# Patient Record
Sex: Female | Born: 1950 | Race: White | Hispanic: No | Marital: Married | Smoking: Never smoker
Health system: Southern US, Community
[De-identification: ages and names within clinical notes are randomized; demographics above are authoritative.]

## PROBLEM LIST (undated history)

## (undated) DIAGNOSIS — N200 Calculus of kidney: Secondary | ICD-10-CM

## (undated) DIAGNOSIS — I10 Essential (primary) hypertension: Secondary | ICD-10-CM

## (undated) HISTORY — DX: Calculus of kidney: N20.0

## (undated) HISTORY — DX: Essential (primary) hypertension: I10

## (undated) HISTORY — PX: APPENDECTOMY: SHX54

## (undated) HISTORY — PX: TONSILLECTOMY: SUR1361

## (undated) HISTORY — PX: CHOLECYSTECTOMY: SHX55

## (undated) HISTORY — PX: TOTAL ABDOMINAL HYSTERECTOMY: SHX209

---

## 2009-05-29 ENCOUNTER — Ambulatory Visit: Payer: Self-pay | Admitting: Family Medicine

## 2010-04-29 ENCOUNTER — Ambulatory Visit: Payer: Self-pay | Admitting: Internal Medicine

## 2011-05-13 ENCOUNTER — Ambulatory Visit: Payer: Self-pay | Admitting: Internal Medicine

## 2012-05-13 ENCOUNTER — Ambulatory Visit: Payer: Self-pay | Admitting: Internal Medicine

## 2013-03-17 LAB — HM COLONOSCOPY

## 2013-07-17 DIAGNOSIS — Z0000 Encounter for general adult medical examination without abnormal findings: Secondary | ICD-10-CM | POA: Diagnosis not present

## 2013-07-17 DIAGNOSIS — Z23 Encounter for immunization: Secondary | ICD-10-CM | POA: Diagnosis not present

## 2013-07-17 DIAGNOSIS — I10 Essential (primary) hypertension: Secondary | ICD-10-CM | POA: Diagnosis not present

## 2013-07-17 DIAGNOSIS — R609 Edema, unspecified: Secondary | ICD-10-CM | POA: Diagnosis not present

## 2013-07-17 DIAGNOSIS — E785 Hyperlipidemia, unspecified: Secondary | ICD-10-CM | POA: Diagnosis not present

## 2013-07-18 DIAGNOSIS — D2360 Other benign neoplasm of skin of unspecified upper limb, including shoulder: Secondary | ICD-10-CM | POA: Diagnosis not present

## 2013-07-18 DIAGNOSIS — L821 Other seborrheic keratosis: Secondary | ICD-10-CM | POA: Diagnosis not present

## 2013-07-18 DIAGNOSIS — Z872 Personal history of diseases of the skin and subcutaneous tissue: Secondary | ICD-10-CM | POA: Diagnosis not present

## 2013-07-18 DIAGNOSIS — Z1283 Encounter for screening for malignant neoplasm of skin: Secondary | ICD-10-CM | POA: Diagnosis not present

## 2013-07-18 DIAGNOSIS — D2320 Other benign neoplasm of skin of unspecified ear and external auricular canal: Secondary | ICD-10-CM | POA: Diagnosis not present

## 2013-07-18 DIAGNOSIS — D235 Other benign neoplasm of skin of trunk: Secondary | ICD-10-CM | POA: Diagnosis not present

## 2013-07-18 DIAGNOSIS — D485 Neoplasm of uncertain behavior of skin: Secondary | ICD-10-CM | POA: Diagnosis not present

## 2013-08-04 ENCOUNTER — Ambulatory Visit: Payer: Self-pay | Admitting: Internal Medicine

## 2013-08-04 DIAGNOSIS — Z1231 Encounter for screening mammogram for malignant neoplasm of breast: Secondary | ICD-10-CM | POA: Diagnosis not present

## 2013-08-04 LAB — HM MAMMOGRAPHY: HM Mammogram: NORMAL

## 2013-08-25 ENCOUNTER — Ambulatory Visit: Payer: Self-pay | Admitting: Gastroenterology

## 2013-08-25 DIAGNOSIS — M1610 Unilateral primary osteoarthritis, unspecified hip: Secondary | ICD-10-CM | POA: Diagnosis not present

## 2013-08-25 DIAGNOSIS — M810 Age-related osteoporosis without current pathological fracture: Secondary | ICD-10-CM | POA: Diagnosis not present

## 2013-08-25 DIAGNOSIS — Z880 Allergy status to penicillin: Secondary | ICD-10-CM | POA: Diagnosis not present

## 2013-08-25 DIAGNOSIS — Z7982 Long term (current) use of aspirin: Secondary | ICD-10-CM | POA: Diagnosis not present

## 2013-08-25 DIAGNOSIS — Z860100 Personal history of colon polyps, unspecified: Secondary | ICD-10-CM | POA: Diagnosis not present

## 2013-08-25 DIAGNOSIS — I10 Essential (primary) hypertension: Secondary | ICD-10-CM | POA: Diagnosis not present

## 2013-08-25 DIAGNOSIS — K648 Other hemorrhoids: Secondary | ICD-10-CM | POA: Diagnosis not present

## 2013-08-25 DIAGNOSIS — E785 Hyperlipidemia, unspecified: Secondary | ICD-10-CM | POA: Diagnosis not present

## 2013-08-25 DIAGNOSIS — Z79899 Other long term (current) drug therapy: Secondary | ICD-10-CM | POA: Diagnosis not present

## 2013-08-25 DIAGNOSIS — Z1211 Encounter for screening for malignant neoplasm of colon: Secondary | ICD-10-CM | POA: Diagnosis not present

## 2013-08-25 DIAGNOSIS — D126 Benign neoplasm of colon, unspecified: Secondary | ICD-10-CM | POA: Diagnosis not present

## 2013-08-28 DIAGNOSIS — M999 Biomechanical lesion, unspecified: Secondary | ICD-10-CM | POA: Diagnosis not present

## 2013-08-28 DIAGNOSIS — IMO0002 Reserved for concepts with insufficient information to code with codable children: Secondary | ICD-10-CM | POA: Diagnosis not present

## 2013-08-28 DIAGNOSIS — M5137 Other intervertebral disc degeneration, lumbosacral region: Secondary | ICD-10-CM | POA: Diagnosis not present

## 2013-08-28 DIAGNOSIS — M5430 Sciatica, unspecified side: Secondary | ICD-10-CM | POA: Diagnosis not present

## 2013-08-29 DIAGNOSIS — IMO0002 Reserved for concepts with insufficient information to code with codable children: Secondary | ICD-10-CM | POA: Diagnosis not present

## 2013-08-29 DIAGNOSIS — M5137 Other intervertebral disc degeneration, lumbosacral region: Secondary | ICD-10-CM | POA: Diagnosis not present

## 2013-08-29 DIAGNOSIS — M999 Biomechanical lesion, unspecified: Secondary | ICD-10-CM | POA: Diagnosis not present

## 2013-08-29 DIAGNOSIS — M5430 Sciatica, unspecified side: Secondary | ICD-10-CM | POA: Diagnosis not present

## 2013-08-30 DIAGNOSIS — M999 Biomechanical lesion, unspecified: Secondary | ICD-10-CM | POA: Diagnosis not present

## 2013-08-30 DIAGNOSIS — IMO0002 Reserved for concepts with insufficient information to code with codable children: Secondary | ICD-10-CM | POA: Diagnosis not present

## 2013-08-30 DIAGNOSIS — M5137 Other intervertebral disc degeneration, lumbosacral region: Secondary | ICD-10-CM | POA: Diagnosis not present

## 2013-08-30 DIAGNOSIS — M5430 Sciatica, unspecified side: Secondary | ICD-10-CM | POA: Diagnosis not present

## 2013-09-01 DIAGNOSIS — M5430 Sciatica, unspecified side: Secondary | ICD-10-CM | POA: Diagnosis not present

## 2013-09-01 DIAGNOSIS — M999 Biomechanical lesion, unspecified: Secondary | ICD-10-CM | POA: Diagnosis not present

## 2013-09-01 DIAGNOSIS — M5137 Other intervertebral disc degeneration, lumbosacral region: Secondary | ICD-10-CM | POA: Diagnosis not present

## 2013-09-01 DIAGNOSIS — IMO0002 Reserved for concepts with insufficient information to code with codable children: Secondary | ICD-10-CM | POA: Diagnosis not present

## 2013-09-05 DIAGNOSIS — M999 Biomechanical lesion, unspecified: Secondary | ICD-10-CM | POA: Diagnosis not present

## 2013-09-05 DIAGNOSIS — M5137 Other intervertebral disc degeneration, lumbosacral region: Secondary | ICD-10-CM | POA: Diagnosis not present

## 2013-09-05 DIAGNOSIS — M5430 Sciatica, unspecified side: Secondary | ICD-10-CM | POA: Diagnosis not present

## 2013-09-05 DIAGNOSIS — IMO0002 Reserved for concepts with insufficient information to code with codable children: Secondary | ICD-10-CM | POA: Diagnosis not present

## 2013-09-06 DIAGNOSIS — M5137 Other intervertebral disc degeneration, lumbosacral region: Secondary | ICD-10-CM | POA: Diagnosis not present

## 2013-09-06 DIAGNOSIS — IMO0002 Reserved for concepts with insufficient information to code with codable children: Secondary | ICD-10-CM | POA: Diagnosis not present

## 2013-09-06 DIAGNOSIS — M5430 Sciatica, unspecified side: Secondary | ICD-10-CM | POA: Diagnosis not present

## 2013-09-06 DIAGNOSIS — M999 Biomechanical lesion, unspecified: Secondary | ICD-10-CM | POA: Diagnosis not present

## 2013-09-08 DIAGNOSIS — M999 Biomechanical lesion, unspecified: Secondary | ICD-10-CM | POA: Diagnosis not present

## 2013-09-08 DIAGNOSIS — M5430 Sciatica, unspecified side: Secondary | ICD-10-CM | POA: Diagnosis not present

## 2013-09-08 DIAGNOSIS — M5137 Other intervertebral disc degeneration, lumbosacral region: Secondary | ICD-10-CM | POA: Diagnosis not present

## 2013-09-08 DIAGNOSIS — IMO0002 Reserved for concepts with insufficient information to code with codable children: Secondary | ICD-10-CM | POA: Diagnosis not present

## 2013-09-12 DIAGNOSIS — M5430 Sciatica, unspecified side: Secondary | ICD-10-CM | POA: Diagnosis not present

## 2013-09-12 DIAGNOSIS — IMO0002 Reserved for concepts with insufficient information to code with codable children: Secondary | ICD-10-CM | POA: Diagnosis not present

## 2013-09-12 DIAGNOSIS — M999 Biomechanical lesion, unspecified: Secondary | ICD-10-CM | POA: Diagnosis not present

## 2013-09-12 DIAGNOSIS — M5137 Other intervertebral disc degeneration, lumbosacral region: Secondary | ICD-10-CM | POA: Diagnosis not present

## 2013-09-13 DIAGNOSIS — D485 Neoplasm of uncertain behavior of skin: Secondary | ICD-10-CM | POA: Diagnosis not present

## 2013-09-13 DIAGNOSIS — M999 Biomechanical lesion, unspecified: Secondary | ICD-10-CM | POA: Diagnosis not present

## 2013-09-13 DIAGNOSIS — M5430 Sciatica, unspecified side: Secondary | ICD-10-CM | POA: Diagnosis not present

## 2013-09-13 DIAGNOSIS — M5137 Other intervertebral disc degeneration, lumbosacral region: Secondary | ICD-10-CM | POA: Diagnosis not present

## 2013-09-13 DIAGNOSIS — IMO0002 Reserved for concepts with insufficient information to code with codable children: Secondary | ICD-10-CM | POA: Diagnosis not present

## 2013-09-14 DIAGNOSIS — M999 Biomechanical lesion, unspecified: Secondary | ICD-10-CM | POA: Diagnosis not present

## 2013-09-14 DIAGNOSIS — M5430 Sciatica, unspecified side: Secondary | ICD-10-CM | POA: Diagnosis not present

## 2013-09-14 DIAGNOSIS — M5137 Other intervertebral disc degeneration, lumbosacral region: Secondary | ICD-10-CM | POA: Diagnosis not present

## 2013-09-14 DIAGNOSIS — IMO0002 Reserved for concepts with insufficient information to code with codable children: Secondary | ICD-10-CM | POA: Diagnosis not present

## 2013-09-19 DIAGNOSIS — M5137 Other intervertebral disc degeneration, lumbosacral region: Secondary | ICD-10-CM | POA: Diagnosis not present

## 2013-09-19 DIAGNOSIS — M999 Biomechanical lesion, unspecified: Secondary | ICD-10-CM | POA: Diagnosis not present

## 2013-09-19 DIAGNOSIS — IMO0002 Reserved for concepts with insufficient information to code with codable children: Secondary | ICD-10-CM | POA: Diagnosis not present

## 2013-09-19 DIAGNOSIS — M5430 Sciatica, unspecified side: Secondary | ICD-10-CM | POA: Diagnosis not present

## 2013-09-20 DIAGNOSIS — IMO0002 Reserved for concepts with insufficient information to code with codable children: Secondary | ICD-10-CM | POA: Diagnosis not present

## 2013-09-20 DIAGNOSIS — M999 Biomechanical lesion, unspecified: Secondary | ICD-10-CM | POA: Diagnosis not present

## 2013-09-20 DIAGNOSIS — M5430 Sciatica, unspecified side: Secondary | ICD-10-CM | POA: Diagnosis not present

## 2013-09-20 DIAGNOSIS — M5137 Other intervertebral disc degeneration, lumbosacral region: Secondary | ICD-10-CM | POA: Diagnosis not present

## 2013-09-22 DIAGNOSIS — IMO0002 Reserved for concepts with insufficient information to code with codable children: Secondary | ICD-10-CM | POA: Diagnosis not present

## 2013-09-22 DIAGNOSIS — M5430 Sciatica, unspecified side: Secondary | ICD-10-CM | POA: Diagnosis not present

## 2013-09-22 DIAGNOSIS — M999 Biomechanical lesion, unspecified: Secondary | ICD-10-CM | POA: Diagnosis not present

## 2013-09-22 DIAGNOSIS — M5137 Other intervertebral disc degeneration, lumbosacral region: Secondary | ICD-10-CM | POA: Diagnosis not present

## 2013-09-26 DIAGNOSIS — M999 Biomechanical lesion, unspecified: Secondary | ICD-10-CM | POA: Diagnosis not present

## 2013-09-26 DIAGNOSIS — IMO0002 Reserved for concepts with insufficient information to code with codable children: Secondary | ICD-10-CM | POA: Diagnosis not present

## 2013-09-26 DIAGNOSIS — M5137 Other intervertebral disc degeneration, lumbosacral region: Secondary | ICD-10-CM | POA: Diagnosis not present

## 2013-09-26 DIAGNOSIS — D485 Neoplasm of uncertain behavior of skin: Secondary | ICD-10-CM | POA: Diagnosis not present

## 2013-09-26 DIAGNOSIS — M5430 Sciatica, unspecified side: Secondary | ICD-10-CM | POA: Diagnosis not present

## 2013-09-27 DIAGNOSIS — M5137 Other intervertebral disc degeneration, lumbosacral region: Secondary | ICD-10-CM | POA: Diagnosis not present

## 2013-09-27 DIAGNOSIS — IMO0002 Reserved for concepts with insufficient information to code with codable children: Secondary | ICD-10-CM | POA: Diagnosis not present

## 2013-09-27 DIAGNOSIS — M999 Biomechanical lesion, unspecified: Secondary | ICD-10-CM | POA: Diagnosis not present

## 2013-09-27 DIAGNOSIS — M5430 Sciatica, unspecified side: Secondary | ICD-10-CM | POA: Diagnosis not present

## 2013-10-04 DIAGNOSIS — IMO0002 Reserved for concepts with insufficient information to code with codable children: Secondary | ICD-10-CM | POA: Diagnosis not present

## 2013-10-04 DIAGNOSIS — M999 Biomechanical lesion, unspecified: Secondary | ICD-10-CM | POA: Diagnosis not present

## 2013-10-04 DIAGNOSIS — M5430 Sciatica, unspecified side: Secondary | ICD-10-CM | POA: Diagnosis not present

## 2013-10-04 DIAGNOSIS — M5137 Other intervertebral disc degeneration, lumbosacral region: Secondary | ICD-10-CM | POA: Diagnosis not present

## 2013-10-06 DIAGNOSIS — IMO0002 Reserved for concepts with insufficient information to code with codable children: Secondary | ICD-10-CM | POA: Diagnosis not present

## 2013-10-06 DIAGNOSIS — M5430 Sciatica, unspecified side: Secondary | ICD-10-CM | POA: Diagnosis not present

## 2013-10-06 DIAGNOSIS — M999 Biomechanical lesion, unspecified: Secondary | ICD-10-CM | POA: Diagnosis not present

## 2013-10-06 DIAGNOSIS — M5137 Other intervertebral disc degeneration, lumbosacral region: Secondary | ICD-10-CM | POA: Diagnosis not present

## 2013-10-10 DIAGNOSIS — H2510 Age-related nuclear cataract, unspecified eye: Secondary | ICD-10-CM | POA: Diagnosis not present

## 2013-10-11 DIAGNOSIS — M5137 Other intervertebral disc degeneration, lumbosacral region: Secondary | ICD-10-CM | POA: Diagnosis not present

## 2013-10-11 DIAGNOSIS — M999 Biomechanical lesion, unspecified: Secondary | ICD-10-CM | POA: Diagnosis not present

## 2013-10-11 DIAGNOSIS — M5430 Sciatica, unspecified side: Secondary | ICD-10-CM | POA: Diagnosis not present

## 2013-10-11 DIAGNOSIS — IMO0002 Reserved for concepts with insufficient information to code with codable children: Secondary | ICD-10-CM | POA: Diagnosis not present

## 2013-10-13 DIAGNOSIS — M5430 Sciatica, unspecified side: Secondary | ICD-10-CM | POA: Diagnosis not present

## 2013-10-13 DIAGNOSIS — M999 Biomechanical lesion, unspecified: Secondary | ICD-10-CM | POA: Diagnosis not present

## 2013-10-13 DIAGNOSIS — M5137 Other intervertebral disc degeneration, lumbosacral region: Secondary | ICD-10-CM | POA: Diagnosis not present

## 2013-10-13 DIAGNOSIS — IMO0002 Reserved for concepts with insufficient information to code with codable children: Secondary | ICD-10-CM | POA: Diagnosis not present

## 2013-10-17 DIAGNOSIS — M659 Synovitis and tenosynovitis, unspecified: Secondary | ICD-10-CM | POA: Diagnosis not present

## 2013-10-18 DIAGNOSIS — M5137 Other intervertebral disc degeneration, lumbosacral region: Secondary | ICD-10-CM | POA: Diagnosis not present

## 2013-10-18 DIAGNOSIS — IMO0002 Reserved for concepts with insufficient information to code with codable children: Secondary | ICD-10-CM | POA: Diagnosis not present

## 2013-10-18 DIAGNOSIS — M999 Biomechanical lesion, unspecified: Secondary | ICD-10-CM | POA: Diagnosis not present

## 2013-10-18 DIAGNOSIS — M5430 Sciatica, unspecified side: Secondary | ICD-10-CM | POA: Diagnosis not present

## 2013-10-20 DIAGNOSIS — M999 Biomechanical lesion, unspecified: Secondary | ICD-10-CM | POA: Diagnosis not present

## 2013-10-20 DIAGNOSIS — IMO0002 Reserved for concepts with insufficient information to code with codable children: Secondary | ICD-10-CM | POA: Diagnosis not present

## 2013-10-20 DIAGNOSIS — M5137 Other intervertebral disc degeneration, lumbosacral region: Secondary | ICD-10-CM | POA: Diagnosis not present

## 2013-10-20 DIAGNOSIS — M5430 Sciatica, unspecified side: Secondary | ICD-10-CM | POA: Diagnosis not present

## 2013-10-27 DIAGNOSIS — M5430 Sciatica, unspecified side: Secondary | ICD-10-CM | POA: Diagnosis not present

## 2013-10-27 DIAGNOSIS — M999 Biomechanical lesion, unspecified: Secondary | ICD-10-CM | POA: Diagnosis not present

## 2013-10-27 DIAGNOSIS — IMO0002 Reserved for concepts with insufficient information to code with codable children: Secondary | ICD-10-CM | POA: Diagnosis not present

## 2013-10-27 DIAGNOSIS — M5137 Other intervertebral disc degeneration, lumbosacral region: Secondary | ICD-10-CM | POA: Diagnosis not present

## 2013-11-16 DIAGNOSIS — Z23 Encounter for immunization: Secondary | ICD-10-CM | POA: Diagnosis not present

## 2014-02-06 ENCOUNTER — Ambulatory Visit: Payer: Self-pay | Admitting: Family Medicine

## 2014-02-06 DIAGNOSIS — I10 Essential (primary) hypertension: Secondary | ICD-10-CM | POA: Diagnosis not present

## 2014-02-06 DIAGNOSIS — S92352A Displaced fracture of fifth metatarsal bone, left foot, initial encounter for closed fracture: Secondary | ICD-10-CM | POA: Diagnosis not present

## 2014-02-06 DIAGNOSIS — X58XXXA Exposure to other specified factors, initial encounter: Secondary | ICD-10-CM | POA: Diagnosis not present

## 2014-02-06 DIAGNOSIS — Y93K1 Activity, walking an animal: Secondary | ICD-10-CM | POA: Diagnosis not present

## 2014-02-06 DIAGNOSIS — M810 Age-related osteoporosis without current pathological fracture: Secondary | ICD-10-CM | POA: Diagnosis not present

## 2014-02-06 DIAGNOSIS — M79672 Pain in left foot: Secondary | ICD-10-CM | POA: Diagnosis not present

## 2014-02-07 DIAGNOSIS — S92355A Nondisplaced fracture of fifth metatarsal bone, left foot, initial encounter for closed fracture: Secondary | ICD-10-CM | POA: Diagnosis not present

## 2014-03-07 DIAGNOSIS — S92355D Nondisplaced fracture of fifth metatarsal bone, left foot, subsequent encounter for fracture with routine healing: Secondary | ICD-10-CM | POA: Diagnosis not present

## 2014-03-15 DIAGNOSIS — M4850XA Collapsed vertebra, not elsewhere classified, site unspecified, initial encounter for fracture: Secondary | ICD-10-CM | POA: Diagnosis not present

## 2014-03-15 DIAGNOSIS — M533 Sacrococcygeal disorders, not elsewhere classified: Secondary | ICD-10-CM | POA: Diagnosis not present

## 2014-03-15 DIAGNOSIS — I10 Essential (primary) hypertension: Secondary | ICD-10-CM | POA: Diagnosis not present

## 2014-03-22 ENCOUNTER — Ambulatory Visit: Payer: Self-pay | Admitting: Internal Medicine

## 2014-03-22 DIAGNOSIS — M810 Age-related osteoporosis without current pathological fracture: Secondary | ICD-10-CM | POA: Diagnosis not present

## 2014-03-22 DIAGNOSIS — Z13820 Encounter for screening for osteoporosis: Secondary | ICD-10-CM | POA: Diagnosis not present

## 2014-03-28 DIAGNOSIS — M1811 Unilateral primary osteoarthritis of first carpometacarpal joint, right hand: Secondary | ICD-10-CM | POA: Diagnosis not present

## 2014-04-04 DIAGNOSIS — S92355D Nondisplaced fracture of fifth metatarsal bone, left foot, subsequent encounter for fracture with routine healing: Secondary | ICD-10-CM | POA: Diagnosis not present

## 2014-05-02 DIAGNOSIS — S92355D Nondisplaced fracture of fifth metatarsal bone, left foot, subsequent encounter for fracture with routine healing: Secondary | ICD-10-CM | POA: Diagnosis not present

## 2014-05-02 DIAGNOSIS — M654 Radial styloid tenosynovitis [de Quervain]: Secondary | ICD-10-CM | POA: Diagnosis not present

## 2014-06-01 DIAGNOSIS — I10 Essential (primary) hypertension: Secondary | ICD-10-CM | POA: Diagnosis not present

## 2014-06-01 DIAGNOSIS — R319 Hematuria, unspecified: Secondary | ICD-10-CM | POA: Diagnosis not present

## 2014-06-13 DIAGNOSIS — M9903 Segmental and somatic dysfunction of lumbar region: Secondary | ICD-10-CM | POA: Diagnosis not present

## 2014-06-13 DIAGNOSIS — M546 Pain in thoracic spine: Secondary | ICD-10-CM | POA: Diagnosis not present

## 2014-06-13 DIAGNOSIS — M5136 Other intervertebral disc degeneration, lumbar region: Secondary | ICD-10-CM | POA: Diagnosis not present

## 2014-06-13 DIAGNOSIS — M9902 Segmental and somatic dysfunction of thoracic region: Secondary | ICD-10-CM | POA: Diagnosis not present

## 2014-06-13 DIAGNOSIS — M461 Sacroiliitis, not elsewhere classified: Secondary | ICD-10-CM | POA: Diagnosis not present

## 2014-06-13 DIAGNOSIS — M9905 Segmental and somatic dysfunction of pelvic region: Secondary | ICD-10-CM | POA: Diagnosis not present

## 2014-06-15 DIAGNOSIS — M9902 Segmental and somatic dysfunction of thoracic region: Secondary | ICD-10-CM | POA: Diagnosis not present

## 2014-06-15 DIAGNOSIS — M546 Pain in thoracic spine: Secondary | ICD-10-CM | POA: Diagnosis not present

## 2014-06-15 DIAGNOSIS — M461 Sacroiliitis, not elsewhere classified: Secondary | ICD-10-CM | POA: Diagnosis not present

## 2014-06-15 DIAGNOSIS — M5136 Other intervertebral disc degeneration, lumbar region: Secondary | ICD-10-CM | POA: Diagnosis not present

## 2014-06-15 DIAGNOSIS — R319 Hematuria, unspecified: Secondary | ICD-10-CM | POA: Diagnosis not present

## 2014-06-15 DIAGNOSIS — M9903 Segmental and somatic dysfunction of lumbar region: Secondary | ICD-10-CM | POA: Diagnosis not present

## 2014-06-15 DIAGNOSIS — M9905 Segmental and somatic dysfunction of pelvic region: Secondary | ICD-10-CM | POA: Diagnosis not present

## 2014-06-15 HISTORY — PX: FOOT FRACTURE SURGERY: SHX645

## 2014-06-19 DIAGNOSIS — M9903 Segmental and somatic dysfunction of lumbar region: Secondary | ICD-10-CM | POA: Diagnosis not present

## 2014-06-19 DIAGNOSIS — M546 Pain in thoracic spine: Secondary | ICD-10-CM | POA: Diagnosis not present

## 2014-06-19 DIAGNOSIS — M9905 Segmental and somatic dysfunction of pelvic region: Secondary | ICD-10-CM | POA: Diagnosis not present

## 2014-06-19 DIAGNOSIS — M9902 Segmental and somatic dysfunction of thoracic region: Secondary | ICD-10-CM | POA: Diagnosis not present

## 2014-06-19 DIAGNOSIS — M461 Sacroiliitis, not elsewhere classified: Secondary | ICD-10-CM | POA: Diagnosis not present

## 2014-06-19 DIAGNOSIS — M5136 Other intervertebral disc degeneration, lumbar region: Secondary | ICD-10-CM | POA: Diagnosis not present

## 2014-06-21 DIAGNOSIS — M654 Radial styloid tenosynovitis [de Quervain]: Secondary | ICD-10-CM | POA: Diagnosis not present

## 2014-06-21 DIAGNOSIS — M9905 Segmental and somatic dysfunction of pelvic region: Secondary | ICD-10-CM | POA: Diagnosis not present

## 2014-06-21 DIAGNOSIS — M9902 Segmental and somatic dysfunction of thoracic region: Secondary | ICD-10-CM | POA: Diagnosis not present

## 2014-06-21 DIAGNOSIS — M9903 Segmental and somatic dysfunction of lumbar region: Secondary | ICD-10-CM | POA: Diagnosis not present

## 2014-06-21 DIAGNOSIS — M5136 Other intervertebral disc degeneration, lumbar region: Secondary | ICD-10-CM | POA: Diagnosis not present

## 2014-06-21 DIAGNOSIS — M546 Pain in thoracic spine: Secondary | ICD-10-CM | POA: Diagnosis not present

## 2014-06-21 DIAGNOSIS — M461 Sacroiliitis, not elsewhere classified: Secondary | ICD-10-CM | POA: Diagnosis not present

## 2014-06-21 DIAGNOSIS — S92355D Nondisplaced fracture of fifth metatarsal bone, left foot, subsequent encounter for fracture with routine healing: Secondary | ICD-10-CM | POA: Diagnosis not present

## 2014-06-26 DIAGNOSIS — M546 Pain in thoracic spine: Secondary | ICD-10-CM | POA: Diagnosis not present

## 2014-06-26 DIAGNOSIS — M461 Sacroiliitis, not elsewhere classified: Secondary | ICD-10-CM | POA: Diagnosis not present

## 2014-06-26 DIAGNOSIS — M9903 Segmental and somatic dysfunction of lumbar region: Secondary | ICD-10-CM | POA: Diagnosis not present

## 2014-06-26 DIAGNOSIS — M9905 Segmental and somatic dysfunction of pelvic region: Secondary | ICD-10-CM | POA: Diagnosis not present

## 2014-06-26 DIAGNOSIS — M5136 Other intervertebral disc degeneration, lumbar region: Secondary | ICD-10-CM | POA: Diagnosis not present

## 2014-06-26 DIAGNOSIS — M9902 Segmental and somatic dysfunction of thoracic region: Secondary | ICD-10-CM | POA: Diagnosis not present

## 2014-07-05 DIAGNOSIS — M461 Sacroiliitis, not elsewhere classified: Secondary | ICD-10-CM | POA: Diagnosis not present

## 2014-07-05 DIAGNOSIS — M546 Pain in thoracic spine: Secondary | ICD-10-CM | POA: Diagnosis not present

## 2014-07-05 DIAGNOSIS — M9902 Segmental and somatic dysfunction of thoracic region: Secondary | ICD-10-CM | POA: Diagnosis not present

## 2014-07-05 DIAGNOSIS — M9903 Segmental and somatic dysfunction of lumbar region: Secondary | ICD-10-CM | POA: Diagnosis not present

## 2014-07-05 DIAGNOSIS — M5136 Other intervertebral disc degeneration, lumbar region: Secondary | ICD-10-CM | POA: Diagnosis not present

## 2014-07-05 DIAGNOSIS — M9905 Segmental and somatic dysfunction of pelvic region: Secondary | ICD-10-CM | POA: Diagnosis not present

## 2014-07-12 DIAGNOSIS — G8918 Other acute postprocedural pain: Secondary | ICD-10-CM | POA: Diagnosis not present

## 2014-07-12 DIAGNOSIS — S92355D Nondisplaced fracture of fifth metatarsal bone, left foot, subsequent encounter for fracture with routine healing: Secondary | ICD-10-CM | POA: Diagnosis not present

## 2014-07-12 DIAGNOSIS — S92352K Displaced fracture of fifth metatarsal bone, left foot, subsequent encounter for fracture with nonunion: Secondary | ICD-10-CM | POA: Diagnosis not present

## 2014-07-12 DIAGNOSIS — M654 Radial styloid tenosynovitis [de Quervain]: Secondary | ICD-10-CM | POA: Diagnosis not present

## 2014-07-12 DIAGNOSIS — Y929 Unspecified place or not applicable: Secondary | ICD-10-CM | POA: Diagnosis not present

## 2014-07-12 DIAGNOSIS — X58XXXA Exposure to other specified factors, initial encounter: Secondary | ICD-10-CM | POA: Diagnosis not present

## 2014-07-26 DIAGNOSIS — S92355D Nondisplaced fracture of fifth metatarsal bone, left foot, subsequent encounter for fracture with routine healing: Secondary | ICD-10-CM | POA: Diagnosis not present

## 2014-08-10 ENCOUNTER — Other Ambulatory Visit: Payer: Self-pay | Admitting: Internal Medicine

## 2014-08-10 DIAGNOSIS — E785 Hyperlipidemia, unspecified: Secondary | ICD-10-CM | POA: Diagnosis not present

## 2014-08-10 DIAGNOSIS — Z0000 Encounter for general adult medical examination without abnormal findings: Secondary | ICD-10-CM | POA: Diagnosis not present

## 2014-08-10 DIAGNOSIS — Z23 Encounter for immunization: Secondary | ICD-10-CM | POA: Diagnosis not present

## 2014-08-10 DIAGNOSIS — Z1231 Encounter for screening mammogram for malignant neoplasm of breast: Secondary | ICD-10-CM

## 2014-08-10 DIAGNOSIS — I10 Essential (primary) hypertension: Secondary | ICD-10-CM | POA: Diagnosis not present

## 2014-08-10 DIAGNOSIS — R600 Localized edema: Secondary | ICD-10-CM | POA: Diagnosis not present

## 2014-08-10 LAB — CBC AND DIFFERENTIAL: Hemoglobin: 15.4 g/dL (ref 12.0–16.0)

## 2014-08-10 LAB — LIPID PANEL
Cholesterol: 200 mg/dL (ref 0–200)
HDL: 59 mg/dL (ref 35–70)
LDL CALC: 105 mg/dL
TRIGLYCERIDES: 179 mg/dL — AB (ref 40–160)

## 2014-08-10 LAB — BASIC METABOLIC PANEL
BUN: 18 mg/dL (ref 4–21)
Creatinine: 0.9 mg/dL (ref <=1.1)

## 2014-08-10 LAB — TSH: TSH: 1.66 u[IU]/mL (ref <=5.90)

## 2014-08-15 ENCOUNTER — Ambulatory Visit
Admission: RE | Admit: 2014-08-15 | Discharge: 2014-08-15 | Disposition: A | Discharge status: Home / Self Care | Payer: Medicare Other | Source: Ambulatory Visit | Attending: Internal Medicine | Admitting: Internal Medicine

## 2014-08-15 DIAGNOSIS — Z1231 Encounter for screening mammogram for malignant neoplasm of breast: Secondary | ICD-10-CM | POA: Insufficient documentation

## 2014-08-20 DIAGNOSIS — M7751 Other enthesopathy of right foot: Secondary | ICD-10-CM | POA: Diagnosis not present

## 2014-08-23 DIAGNOSIS — S92355D Nondisplaced fracture of fifth metatarsal bone, left foot, subsequent encounter for fracture with routine healing: Secondary | ICD-10-CM | POA: Diagnosis not present

## 2014-09-24 DIAGNOSIS — M654 Radial styloid tenosynovitis [de Quervain]: Secondary | ICD-10-CM | POA: Diagnosis not present

## 2014-09-24 DIAGNOSIS — S92355D Nondisplaced fracture of fifth metatarsal bone, left foot, subsequent encounter for fracture with routine healing: Secondary | ICD-10-CM | POA: Diagnosis not present

## 2014-09-25 DIAGNOSIS — M9905 Segmental and somatic dysfunction of pelvic region: Secondary | ICD-10-CM | POA: Diagnosis not present

## 2014-09-25 DIAGNOSIS — M9902 Segmental and somatic dysfunction of thoracic region: Secondary | ICD-10-CM | POA: Diagnosis not present

## 2014-09-25 DIAGNOSIS — M461 Sacroiliitis, not elsewhere classified: Secondary | ICD-10-CM | POA: Diagnosis not present

## 2014-09-25 DIAGNOSIS — M5136 Other intervertebral disc degeneration, lumbar region: Secondary | ICD-10-CM | POA: Diagnosis not present

## 2014-09-25 DIAGNOSIS — M546 Pain in thoracic spine: Secondary | ICD-10-CM | POA: Diagnosis not present

## 2014-09-25 DIAGNOSIS — M9903 Segmental and somatic dysfunction of lumbar region: Secondary | ICD-10-CM | POA: Diagnosis not present

## 2014-10-16 DIAGNOSIS — M9905 Segmental and somatic dysfunction of pelvic region: Secondary | ICD-10-CM | POA: Diagnosis not present

## 2014-10-16 DIAGNOSIS — M546 Pain in thoracic spine: Secondary | ICD-10-CM | POA: Diagnosis not present

## 2014-10-16 DIAGNOSIS — M9903 Segmental and somatic dysfunction of lumbar region: Secondary | ICD-10-CM | POA: Diagnosis not present

## 2014-10-16 DIAGNOSIS — M5136 Other intervertebral disc degeneration, lumbar region: Secondary | ICD-10-CM | POA: Diagnosis not present

## 2014-10-16 DIAGNOSIS — M9902 Segmental and somatic dysfunction of thoracic region: Secondary | ICD-10-CM | POA: Diagnosis not present

## 2014-10-16 DIAGNOSIS — M461 Sacroiliitis, not elsewhere classified: Secondary | ICD-10-CM | POA: Diagnosis not present

## 2014-11-07 DIAGNOSIS — D2261 Melanocytic nevi of right upper limb, including shoulder: Secondary | ICD-10-CM | POA: Diagnosis not present

## 2014-11-07 DIAGNOSIS — Z1283 Encounter for screening for malignant neoplasm of skin: Secondary | ICD-10-CM | POA: Diagnosis not present

## 2014-11-07 DIAGNOSIS — L821 Other seborrheic keratosis: Secondary | ICD-10-CM | POA: Diagnosis not present

## 2014-11-07 DIAGNOSIS — Z872 Personal history of diseases of the skin and subcutaneous tissue: Secondary | ICD-10-CM | POA: Diagnosis not present

## 2014-11-07 DIAGNOSIS — D485 Neoplasm of uncertain behavior of skin: Secondary | ICD-10-CM | POA: Diagnosis not present

## 2014-11-13 DIAGNOSIS — M9905 Segmental and somatic dysfunction of pelvic region: Secondary | ICD-10-CM | POA: Diagnosis not present

## 2014-11-13 DIAGNOSIS — H269 Unspecified cataract: Secondary | ICD-10-CM | POA: Diagnosis not present

## 2014-11-13 DIAGNOSIS — M461 Sacroiliitis, not elsewhere classified: Secondary | ICD-10-CM | POA: Diagnosis not present

## 2014-11-13 DIAGNOSIS — M9903 Segmental and somatic dysfunction of lumbar region: Secondary | ICD-10-CM | POA: Diagnosis not present

## 2014-11-13 DIAGNOSIS — M5136 Other intervertebral disc degeneration, lumbar region: Secondary | ICD-10-CM | POA: Diagnosis not present

## 2014-11-13 DIAGNOSIS — M546 Pain in thoracic spine: Secondary | ICD-10-CM | POA: Diagnosis not present

## 2014-11-13 DIAGNOSIS — M9902 Segmental and somatic dysfunction of thoracic region: Secondary | ICD-10-CM | POA: Diagnosis not present

## 2014-11-27 DIAGNOSIS — D225 Melanocytic nevi of trunk: Secondary | ICD-10-CM | POA: Diagnosis not present

## 2014-11-27 DIAGNOSIS — D485 Neoplasm of uncertain behavior of skin: Secondary | ICD-10-CM | POA: Diagnosis not present

## 2014-11-27 DIAGNOSIS — S92355D Nondisplaced fracture of fifth metatarsal bone, left foot, subsequent encounter for fracture with routine healing: Secondary | ICD-10-CM | POA: Diagnosis not present

## 2014-12-12 DIAGNOSIS — Z23 Encounter for immunization: Secondary | ICD-10-CM | POA: Diagnosis not present

## 2015-01-01 DIAGNOSIS — M5136 Other intervertebral disc degeneration, lumbar region: Secondary | ICD-10-CM | POA: Diagnosis not present

## 2015-01-01 DIAGNOSIS — M9903 Segmental and somatic dysfunction of lumbar region: Secondary | ICD-10-CM | POA: Diagnosis not present

## 2015-01-01 DIAGNOSIS — M9902 Segmental and somatic dysfunction of thoracic region: Secondary | ICD-10-CM | POA: Diagnosis not present

## 2015-01-01 DIAGNOSIS — M9905 Segmental and somatic dysfunction of pelvic region: Secondary | ICD-10-CM | POA: Diagnosis not present

## 2015-01-01 DIAGNOSIS — M546 Pain in thoracic spine: Secondary | ICD-10-CM | POA: Diagnosis not present

## 2015-01-01 DIAGNOSIS — M461 Sacroiliitis, not elsewhere classified: Secondary | ICD-10-CM | POA: Diagnosis not present

## 2015-01-16 ENCOUNTER — Other Ambulatory Visit: Payer: Self-pay | Admitting: Internal Medicine

## 2015-01-16 ENCOUNTER — Encounter: Payer: Self-pay | Admitting: Internal Medicine

## 2015-01-16 DIAGNOSIS — M810 Age-related osteoporosis without current pathological fracture: Secondary | ICD-10-CM | POA: Insufficient documentation

## 2015-01-16 DIAGNOSIS — R319 Hematuria, unspecified: Secondary | ICD-10-CM | POA: Insufficient documentation

## 2015-01-16 DIAGNOSIS — R600 Localized edema: Secondary | ICD-10-CM | POA: Insufficient documentation

## 2015-01-16 DIAGNOSIS — IMO0002 Reserved for concepts with insufficient information to code with codable children: Secondary | ICD-10-CM | POA: Insufficient documentation

## 2015-01-16 DIAGNOSIS — M533 Sacrococcygeal disorders, not elsewhere classified: Secondary | ICD-10-CM | POA: Insufficient documentation

## 2015-01-16 DIAGNOSIS — I10 Essential (primary) hypertension: Secondary | ICD-10-CM | POA: Insufficient documentation

## 2015-01-16 DIAGNOSIS — E785 Hyperlipidemia, unspecified: Secondary | ICD-10-CM | POA: Insufficient documentation

## 2015-01-16 DIAGNOSIS — K639 Disease of intestine, unspecified: Secondary | ICD-10-CM | POA: Insufficient documentation

## 2015-01-16 MED ORDER — MELOXICAM 15 MG PO TABS: 15.0000 mg | ORAL_TABLET | Freq: Every day | ORAL | Status: DC

## 2015-04-15 ENCOUNTER — Other Ambulatory Visit: Payer: Self-pay | Admitting: Internal Medicine

## 2015-04-16 NOTE — Telephone Encounter (Signed)
pts coming in on 6/1 for her cpe.

## 2015-05-14 DIAGNOSIS — M9903 Segmental and somatic dysfunction of lumbar region: Secondary | ICD-10-CM | POA: Diagnosis not present

## 2015-05-14 DIAGNOSIS — M9905 Segmental and somatic dysfunction of pelvic region: Secondary | ICD-10-CM | POA: Diagnosis not present

## 2015-05-14 DIAGNOSIS — M9902 Segmental and somatic dysfunction of thoracic region: Secondary | ICD-10-CM | POA: Diagnosis not present

## 2015-05-14 DIAGNOSIS — M5136 Other intervertebral disc degeneration, lumbar region: Secondary | ICD-10-CM | POA: Diagnosis not present

## 2015-05-14 DIAGNOSIS — M6080 Other myositis, unspecified site: Secondary | ICD-10-CM | POA: Diagnosis not present

## 2015-05-14 DIAGNOSIS — M461 Sacroiliitis, not elsewhere classified: Secondary | ICD-10-CM | POA: Diagnosis not present

## 2015-06-04 DIAGNOSIS — Z1283 Encounter for screening for malignant neoplasm of skin: Secondary | ICD-10-CM | POA: Diagnosis not present

## 2015-06-04 DIAGNOSIS — D485 Neoplasm of uncertain behavior of skin: Secondary | ICD-10-CM | POA: Diagnosis not present

## 2015-06-04 DIAGNOSIS — Z872 Personal history of diseases of the skin and subcutaneous tissue: Secondary | ICD-10-CM | POA: Diagnosis not present

## 2015-07-15 DIAGNOSIS — N200 Calculus of kidney: Secondary | ICD-10-CM

## 2015-07-15 HISTORY — DX: Calculus of kidney: N20.0

## 2015-07-30 ENCOUNTER — Other Ambulatory Visit: Payer: Self-pay | Admitting: Internal Medicine

## 2015-07-30 DIAGNOSIS — Z1231 Encounter for screening mammogram for malignant neoplasm of breast: Secondary | ICD-10-CM

## 2015-07-31 ENCOUNTER — Other Ambulatory Visit: Payer: Self-pay | Admitting: Internal Medicine

## 2015-08-01 DIAGNOSIS — M9905 Segmental and somatic dysfunction of pelvic region: Secondary | ICD-10-CM | POA: Diagnosis not present

## 2015-08-01 DIAGNOSIS — M461 Sacroiliitis, not elsewhere classified: Secondary | ICD-10-CM | POA: Diagnosis not present

## 2015-08-01 DIAGNOSIS — M6080 Other myositis, unspecified site: Secondary | ICD-10-CM | POA: Diagnosis not present

## 2015-08-01 DIAGNOSIS — M9902 Segmental and somatic dysfunction of thoracic region: Secondary | ICD-10-CM | POA: Diagnosis not present

## 2015-08-01 DIAGNOSIS — M9903 Segmental and somatic dysfunction of lumbar region: Secondary | ICD-10-CM | POA: Diagnosis not present

## 2015-08-01 DIAGNOSIS — M5136 Other intervertebral disc degeneration, lumbar region: Secondary | ICD-10-CM | POA: Diagnosis not present

## 2015-08-04 DIAGNOSIS — Z880 Allergy status to penicillin: Secondary | ICD-10-CM | POA: Diagnosis not present

## 2015-08-04 DIAGNOSIS — Z79899 Other long term (current) drug therapy: Secondary | ICD-10-CM | POA: Diagnosis not present

## 2015-08-04 DIAGNOSIS — I10 Essential (primary) hypertension: Secondary | ICD-10-CM | POA: Diagnosis not present

## 2015-08-04 DIAGNOSIS — N200 Calculus of kidney: Secondary | ICD-10-CM | POA: Diagnosis not present

## 2015-08-04 DIAGNOSIS — N289 Disorder of kidney and ureter, unspecified: Secondary | ICD-10-CM | POA: Diagnosis not present

## 2015-08-04 DIAGNOSIS — N132 Hydronephrosis with renal and ureteral calculous obstruction: Secondary | ICD-10-CM | POA: Diagnosis not present

## 2015-08-04 DIAGNOSIS — N281 Cyst of kidney, acquired: Secondary | ICD-10-CM | POA: Diagnosis not present

## 2015-08-04 DIAGNOSIS — R1032 Left lower quadrant pain: Secondary | ICD-10-CM | POA: Diagnosis not present

## 2015-08-04 DIAGNOSIS — K5730 Diverticulosis of large intestine without perforation or abscess without bleeding: Secondary | ICD-10-CM | POA: Diagnosis not present

## 2015-08-04 DIAGNOSIS — Z9049 Acquired absence of other specified parts of digestive tract: Secondary | ICD-10-CM | POA: Diagnosis not present

## 2015-08-04 DIAGNOSIS — E785 Hyperlipidemia, unspecified: Secondary | ICD-10-CM | POA: Diagnosis not present

## 2015-08-04 DIAGNOSIS — N1330 Unspecified hydronephrosis: Secondary | ICD-10-CM | POA: Diagnosis not present

## 2015-08-04 DIAGNOSIS — R079 Chest pain, unspecified: Secondary | ICD-10-CM | POA: Diagnosis not present

## 2015-08-04 DIAGNOSIS — K7689 Other specified diseases of liver: Secondary | ICD-10-CM | POA: Diagnosis not present

## 2015-08-04 DIAGNOSIS — N201 Calculus of ureter: Secondary | ICD-10-CM | POA: Diagnosis not present

## 2015-08-04 DIAGNOSIS — Z90710 Acquired absence of both cervix and uterus: Secondary | ICD-10-CM | POA: Diagnosis not present

## 2015-08-04 DIAGNOSIS — Z7982 Long term (current) use of aspirin: Secondary | ICD-10-CM | POA: Diagnosis not present

## 2015-08-04 DIAGNOSIS — R1012 Left upper quadrant pain: Secondary | ICD-10-CM | POA: Diagnosis not present

## 2015-08-15 ENCOUNTER — Encounter: Payer: Self-pay | Admitting: Internal Medicine

## 2015-08-15 ENCOUNTER — Ambulatory Visit (INDEPENDENT_AMBULATORY_CARE_PROVIDER_SITE_OTHER): Payer: Medicare Other | Admitting: Internal Medicine

## 2015-08-15 VITALS — BP 120/80 | HR 68 | Resp 16 | Ht 67.0 in | Wt 184.0 lb

## 2015-08-15 DIAGNOSIS — E785 Hyperlipidemia, unspecified: Secondary | ICD-10-CM

## 2015-08-15 DIAGNOSIS — N200 Calculus of kidney: Secondary | ICD-10-CM

## 2015-08-15 DIAGNOSIS — Z1159 Encounter for screening for other viral diseases: Secondary | ICD-10-CM | POA: Diagnosis not present

## 2015-08-15 DIAGNOSIS — M533 Sacrococcygeal disorders, not elsewhere classified: Secondary | ICD-10-CM | POA: Diagnosis not present

## 2015-08-15 DIAGNOSIS — N3001 Acute cystitis with hematuria: Secondary | ICD-10-CM | POA: Diagnosis not present

## 2015-08-15 DIAGNOSIS — Z1231 Encounter for screening mammogram for malignant neoplasm of breast: Secondary | ICD-10-CM

## 2015-08-15 DIAGNOSIS — M810 Age-related osteoporosis without current pathological fracture: Secondary | ICD-10-CM | POA: Diagnosis not present

## 2015-08-15 DIAGNOSIS — I10 Essential (primary) hypertension: Secondary | ICD-10-CM

## 2015-08-15 DIAGNOSIS — Z0000 Encounter for general adult medical examination without abnormal findings: Secondary | ICD-10-CM

## 2015-08-15 LAB — POC URINALYSIS WITH MICROSCOPIC (NON AUTO)MANUAL RESULT
BILIRUBIN UA: NEGATIVE
Crystals: 0
Epithelial cells, urine per micros: 0
Glucose, UA: NEGATIVE
KETONES UA: NEGATIVE
MUCUS UA: 0
NITRITE UA: NEGATIVE
PH UA: 6
PROTEIN UA: NEGATIVE
RBC: 5 M/uL (ref 4.04–5.48)
SPEC GRAV UA: 1.01
UROBILINOGEN UA: 0.2

## 2015-08-15 MED ORDER — NITROFURANTOIN MONOHYD MACRO 100 MG PO CAPS: 100.0000 mg | ORAL_CAPSULE | Freq: Two times a day (BID) | ORAL | Status: DC

## 2015-08-15 NOTE — Patient Instructions (Signed)
Health Maintenance  Topic Date Due  . INFLUENZA VACCINE  10/15/2015  . MAMMOGRAM  08/14/2016  . PNA vac Low Risk Adult (2 of 2 - PPSV23) 07/18/2018  . TETANUS/TDAP  03/16/2022  . COLONOSCOPY  03/18/2023  . DEXA SCAN  Completed  . ZOSTAVAX  Completed  . Hepatitis C Screening  Addressed    Breast Self-Awareness Practicing breast self-awareness may pick up problems early, prevent significant medical complications, and possibly save your life. By practicing breast self-awareness, you can become familiar with how your breasts look and feel and if your breasts are changing. This allows you to notice changes early. It can also offer you some reassurance that your breast health is good. One way to learn what is normal for your breasts and whether your breasts are changing is to do a breast self-exam. If you find a lump or something that was not present in the past, it is best to contact your caregiver right away. Other findings that should be evaluated by your caregiver include nipple discharge, especially if it is bloody; skin changes or reddening; areas where the skin seems to be pulled in (retracted); or new lumps and bumps. Breast pain is seldom associated with cancer (malignancy), but should also be evaluated by a caregiver. HOW TO PERFORM A BREAST SELF-EXAM The best time to examine your breasts is 5-7 days after your menstrual period is over. During menstruation, the breasts are lumpier, and it may be more difficult to pick up changes. If you do not menstruate, have reached menopause, or had your uterus removed (hysterectomy), you should examine your breasts at regular intervals, such as monthly. If you are breastfeeding, examine your breasts after a feeding or after using a breast pump. Breast implants do not decrease the risk for lumps or tumors, so continue to perform breast self-exams as recommended. Talk to your caregiver about how to determine the difference between the implant and breast tissue.  Also, talk about the amount of pressure you should use during the exam. Over time, you will become more familiar with the variations of your breasts and more comfortable with the exam. A breast self-exam requires you to remove all your clothes above the waist. 1. Look at your breasts and nipples. Stand in front of a mirror in a room with good lighting. With your hands on your hips, push your hands firmly downward. Look for a difference in shape, contour, and size from one breast to the other (asymmetry). Asymmetry includes puckers, dips, or bumps. Also, look for skin changes, such as reddened or scaly areas on the breasts. Look for nipple changes, such as discharge, dimpling, repositioning, or redness. 2. Carefully feel your breasts. This is best done either in the shower or tub while using soapy water or when flat on your back. Place the arm (on the side of the breast you are examining) above your head. Use the pads (not the fingertips) of your three middle fingers on your opposite hand to feel your breasts. Start in the underarm area and use  inch (2 cm) overlapping circles to feel your breast. Use 3 different levels of pressure (light, medium, and firm pressure) at each circle before moving to the next circle. The light pressure is needed to feel the tissue closest to the skin. The medium pressure will help to feel breast tissue a little deeper, while the firm pressure is needed to feel the tissue close to the ribs. Continue the overlapping circles, moving downward over the breast until  you feel your ribs below your breast. Then, move one finger-width towards the center of the body. Continue to use the  inch (2 cm) overlapping circles to feel your breast as you move slowly up toward the collar bone (clavicle) near the base of the neck. Continue the up and down exam using all 3 pressures until you reach the middle of the chest. Do this with each breast, carefully feeling for lumps or changes. 3.  Keep a  written record with breast changes or normal findings for each breast. By writing this information down, you do not need to depend only on memory for size, tenderness, or location. Write down where you are in your menstrual cycle, if you are still menstruating. Breast tissue can have some lumps or thick tissue. However, see your caregiver if you find anything that concerns you.  SEEK MEDICAL CARE IF:  You see a change in shape, contour, or size of your breasts or nipples.   You see skin changes, such as reddened or scaly areas on the breasts or nipples.   You have an unusual discharge from your nipples.   You feel a new lump or unusually thick areas.    This information is not intended to replace advice given to you by your health care provider. Make sure you discuss any questions you have with your health care provider.   Document Released: 03/02/2005 Document Revised: 02/17/2012 Document Reviewed: 06/17/2011 Elsevier Interactive Patient Education [employer].

## 2015-08-15 NOTE — Progress Notes (Signed)
Patient: Alicia Hubbard, Female    DOB: 1950/04/03, 65 y.o.   MRN: [id number] Visit Date: 08/15/2015  Today's Provider: Halina Maidens, MD   Chief Complaint  Patient presents with  . Medicare Wellness    Passed kidney stone a month ago at hospital   . Hyperlipidemia  . Hypertension   Subjective:    Annual wellness visit Alicia Hubbard is a 65 y.o. female who presents today for her Subsequent Annual Wellness Visit. She feels well. She reports exercising walking. She reports she is sleeping fairly well. She has cut back on eating and is exercising and has lost 20 lbs.  She has a mammogram scheduled.  ----------------------------------------------------------- Hyperlipidemia This is a chronic problem. The problem is controlled. Recent lipid tests were reviewed and are normal. Pertinent negatives include no chest pain or shortness of breath. Current antihyperlipidemic treatment includes statins.  Hypertension This is a chronic problem. The problem is controlled. Pertinent negatives include no chest pain, headaches, palpitations or shortness of breath. Past treatments include calcium channel blockers and beta blockers. The current treatment provides significant improvement. There are no compliance problems.   Kidney Stone - pt had a left 3 mm stone while on vacation in [place].  She was seen in ER and given medication.  She came home and had complete resolution of symptoms after 2 days.  She strained her urine but never caught a stone.  Since then feels well with no back pain or hematuria.  Review of Systems  Constitutional: Positive for unexpected weight change (20 lbs with diet and exercise). Negative for fever, chills and fatigue.  HENT: Negative for congestion, hearing loss, tinnitus, trouble swallowing and voice change.   Eyes: Negative for visual disturbance.  Respiratory: Negative for cough, chest tightness, shortness of breath and wheezing.   Cardiovascular: Negative for chest  pain, palpitations and leg swelling.  Gastrointestinal: Negative for vomiting, abdominal pain, diarrhea and constipation.  Endocrine: Negative for polydipsia and polyuria.  Genitourinary: Negative for dysuria, frequency, vaginal bleeding, vaginal discharge and genital sores.  Musculoskeletal: Negative for joint swelling, arthralgias and gait problem.  Skin: Negative for color change and rash.  Neurological: Negative for dizziness, tremors, light-headedness and headaches.  Hematological: Negative for adenopathy. Does not bruise/bleed easily.  Psychiatric/Behavioral: Negative for sleep disturbance and dysphoric mood. The patient is not nervous/anxious.     Social History   Social History  . Marital Status: Married    Spouse Name: N/A  . Number of Children: N/A  . Years of Education: N/A   Occupational History  . Not on file.   Social History Main Topics  . Smoking status: Never Smoker   . Smokeless tobacco: Not on file  . Alcohol Use: No  . Drug Use: Not on file  . Sexual Activity: Not on file   Other Topics Concern  . Not on file   Social History Narrative    Patient Active Problem List   Diagnosis Date Noted  . Kidney stones 08/15/2015  . Dyslipidemia 01/16/2015  . Essential (primary) hypertension 01/16/2015  . Blood in the urine 01/16/2015  . Local edema 01/16/2015  . OP (osteoporosis) 01/16/2015  . Sacrococcygeal disorders, not elsewhere classified 01/16/2015  . 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 Total abdominal hysterectomy    . Cholecystectomy    . Tonsillectomy    . Appendectomy    . Foot fracture surgery Left 06/2014  Her family history includes CAD in her father and mother; Stroke in her father.    Previous Medications   AMLODIPINE (NORVASC) 5 MG TABLET    TAKE 1 TABLET DAILY   ASPIRIN 81 MG CHEWABLE TABLET    Chew 1 tablet by mouth daily.   CALCIUM CARBONATE-VIT  D-MIN (CALTRATE 600+D PLUS MINERALS) 600-800 MG-UNIT CHEW    Chew by mouth.   MELOXICAM (MOBIC) 15 MG TABLET    TAKE 1 TABLET DAILY   NEBIVOLOL (BYSTOLIC) 5 MG TABLET    Take 1 tablet by mouth daily.   SIMVASTATIN (ZOCOR) 10 MG TABLET    Take 1 tablet by mouth at bedtime.    Patient Care Team: Glean Hess, MD as PCP - General (Family Medicine)     Objective:   Vitals: BP 120/80 mmHg  Pulse 68  Resp 16  Ht 5\' 7"  (1.702 m)  Wt 184 lb (83.462 kg)  BMI 28.81 kg/m2  SpO2 98%  Physical Exam  Constitutional: She is oriented to person, place, and time. She appears well-developed and well-nourished. No distress.  HENT:  Head: Normocephalic and atraumatic.  Right Ear: Tympanic membrane and ear canal normal.  Left Ear: Tympanic membrane and ear canal normal.  Nose: Right sinus exhibits no maxillary sinus tenderness. Left sinus exhibits no maxillary sinus tenderness.  Mouth/Throat: Uvula is midline and oropharynx is clear and moist.  Eyes: Conjunctivae and EOM are normal. Right eye exhibits no discharge. Left eye exhibits no discharge. No scleral icterus.  Neck: Normal range of motion. Carotid bruit is not present. No erythema present. No thyromegaly present.  Cardiovascular: Normal rate, regular rhythm, normal heart sounds and normal pulses.   Pulmonary/Chest: Effort normal. No respiratory distress. She has no wheezes. Right breast exhibits no mass, no nipple discharge, no skin change and no tenderness. Left breast exhibits no mass, no nipple discharge, no skin change and no tenderness.  Abdominal: Soft. Bowel sounds are normal. There is no hepatosplenomegaly. There is no tenderness. There is no CVA tenderness.  Musculoskeletal: Normal range of motion.       Right hip: She exhibits normal range of motion and no tenderness.       Left hip: She exhibits normal range of motion and no tenderness.       Right knee: Normal.       Left knee: Normal.  Lymphadenopathy:    She has no cervical  adenopathy.    She has no axillary adenopathy.  Neurological: She is alert and oriented to person, place, and time. She has normal reflexes. No cranial nerve deficit or sensory deficit.  Skin: Skin is warm, dry and intact. No rash noted.  Multiple nevi over chest and back  Psychiatric: She has a normal mood and affect. Her speech is normal and behavior is normal. Thought content normal.  Nursing note and vitals reviewed.   Activities of Daily Living In your present state of health, do you have any difficulty performing the following activities: 08/15/2015  Hearing? N  Vision? N  Difficulty concentrating or making decisions? N  Walking or climbing stairs? N  Dressing or bathing? N  Doing errands, shopping? N  Preparing Food and eating ? N  Using the Toilet? N  In the past six months, have you accidently leaked urine? N  Do you have problems with loss of bowel control? N  Managing your Medications? N  Managing your Finances? N  Housekeeping or managing your Housekeeping? N    Fall Risk  Assessment Fall Risk  08/15/2015  Falls in the past year? No      Depression Screen PHQ 2/9 Scores 08/15/2015  PHQ - 2 Score 0    Cognitive Testing - 6-CIT   Correct? Score   What year is it? yes 0 Yes = 0    No = 4  What month is it? yes 0 Yes = 0    No = 3  Remember:     Pia [address]au, [place], [place]     What time is it? yes 0 Yes = 0    No = 3  Count backwards from 20 to 1 yes 0 Correct = 0    1 error = 2   More than 1 error = 4  Say the months of the year in reverse. yes 0 Correct = 0    1 error = 2   More than 1 error = 4  What address did I ask you to remember? yes 0 Correct = 0  1 error = 2    2 error = 4    3 error = 6    4 error = 8    All wrong = 10       TOTAL SCORE  0/28   Interpretation:  Normal  Normal (0-7) Abnormal (8-28)      Medicare Annual Wellness Visit Summary:  Reviewed patient's Family Medical History Reviewed and updated list of patient's medical  providers Assessment of cognitive impairment was done Assessed patient's functional ability Established a written schedule for health screening [place] Completed and Reviewed  Exercise Activities and Dietary recommendations Goals    None      Immunization History  Administered Date(s) Administered  . Pneumococcal Conjugate-13 08/10/2014  . Pneumococcal Polysaccharide-23 07/17/2013  . Tdap 03/16/2012  . Zoster 09/23/2010    Health Maintenance  Topic Date Due  . Hepatitis C Screening  11-Apr-1950  . INFLUENZA VACCINE  10/15/2015  . MAMMOGRAM  08/14/2016  . PNA vac Low Risk Adult (2 of 2 - PPSV23) 07/18/2018  . TETANUS/TDAP  03/16/2022  . COLONOSCOPY  03/18/2023  . DEXA SCAN  Completed  . ZOSTAVAX  Completed     Discussed health benefits of physical activity, and encouraged her to engage in regular exercise appropriate for her age and condition.    ------------------------------------------------------------------------------------------------------------   Assessment & Plan:  1. Preventative health care MAW measures satisfied Continue diet and exercise  2. Essential (primary) hypertension controlled - CBC with Differential/Platelet - Comprehensive metabolic panel - TSH  3. OP (osteoporosis) Continue calcium and vitamin D  4. Dyslipidemia On statin therapy - Lipid panel  5. Kidney stones Passed spontaneously but with evidence of infection Stone may still be in the bladder - POC urinalysis w microscopic (non auto)  6. Need for hepatitis C screening test - Hepatitis C antibody  7. Encounter for screening mammogram for breast cancer Mammogram scheduled  8. Sacrococcygeal disorders, not elsewhere classified Continue meloxicam - may reduce dose to 7.5 mg  9. Acute cystitis with hematuria Return for recheck in 3-4 weeks - nitrofurantoin, macrocrystal-monohydrate, (MACROBID) 100 MG capsule; Take 1 capsule (100 mg total) by mouth 2  (two) times daily.  Dispense: 14 capsule; Refill: 0   Halina Maidens, MD [facility]  08/15/2015

## 2015-08-16 LAB — COMPREHENSIVE METABOLIC PANEL
A/G RATIO: 1.3 (ref 1.2–2.2)
ALBUMIN: 3.9 g/dL (ref 3.6–4.8)
ALT: 19 IU/L (ref 0–32)
AST: 18 IU/L (ref 0–40)
Alkaline Phosphatase: 57 IU/L (ref 39–117)
BILIRUBIN TOTAL: 0.3 mg/dL (ref 0.0–1.2)
BUN / CREAT RATIO: 19 (ref 12–28)
BUN: 18 mg/dL (ref 8–27)
CALCIUM: 9 mg/dL (ref 8.7–10.3)
CHLORIDE: 98 mmol/L (ref 96–106)
CO2: 18 mmol/L (ref 18–29)
Creatinine, Ser: 0.97 mg/dL (ref 0.57–1.00)
GFR, EST AFRICAN AMERICAN: 70 mL/min/{1.73_m2} (ref >59)
GFR, EST NON AFRICAN AMERICAN: 61 mL/min/{1.73_m2} (ref >59)
Globulin, Total: 3 g/dL (ref 1.5–4.5)
Glucose: 98 mg/dL (ref 65–99)
POTASSIUM: 4.3 mmol/L (ref 3.5–5.2)
Sodium: 141 mmol/L (ref 134–144)
TOTAL PROTEIN: 6.9 g/dL (ref 6.0–8.5)

## 2015-08-16 LAB — CBC WITH DIFFERENTIAL/PLATELET
BASOS: 1 %
Basophils Absolute: 0.1 10*3/uL (ref 0.0–0.2)
EOS (ABSOLUTE): 0.1 10*3/uL (ref 0.0–0.4)
EOS: 1 %
HEMATOCRIT: 44.4 % (ref 34.0–46.6)
HEMOGLOBIN: 14.5 g/dL (ref 11.1–15.9)
IMMATURE GRANS (ABS): 0 10*3/uL (ref 0.0–0.1)
IMMATURE GRANULOCYTES: 0 %
LYMPHS: 18 %
Lymphocytes Absolute: 1.5 10*3/uL (ref 0.7–3.1)
MCH: 28.7 pg (ref 26.6–33.0)
MCHC: 32.7 g/dL (ref 31.5–35.7)
MCV: 88 fL (ref 79–97)
MONOCYTES: 6 %
Monocytes Absolute: 0.5 10*3/uL (ref 0.1–0.9)
NEUTROS ABS: 6.1 10*3/uL (ref 1.4–7.0)
NEUTROS PCT: 74 %
Platelets: 300 10*3/uL (ref 150–379)
RBC: 5.05 x10E6/uL (ref 3.77–5.28)
RDW: 14 % (ref 12.3–15.4)
WBC: 8.2 10*3/uL (ref 3.4–10.8)

## 2015-08-16 LAB — HEPATITIS C ANTIBODY

## 2015-08-16 LAB — LIPID PANEL
CHOL/HDL RATIO: 3.3 ratio (ref 0.0–4.4)
Cholesterol, Total: 148 mg/dL (ref 100–199)
HDL: 45 mg/dL (ref >39)
LDL Calculated: 78 mg/dL (ref 0–99)
Triglycerides: 124 mg/dL (ref 0–149)
VLDL CHOLESTEROL CAL: 25 mg/dL (ref 5–40)

## 2015-08-16 LAB — TSH: TSH: 0.686 u[IU]/mL (ref 0.450–4.500)

## 2015-08-19 ENCOUNTER — Ambulatory Visit
Admission: RE | Admit: 2015-08-19 | Discharge: 2015-08-19 | Disposition: A | Discharge status: Home / Self Care | Payer: Medicare Other | Source: Ambulatory Visit | Attending: Internal Medicine | Admitting: Internal Medicine

## 2015-08-19 DIAGNOSIS — Z1231 Encounter for screening mammogram for malignant neoplasm of breast: Secondary | ICD-10-CM | POA: Diagnosis not present

## 2015-08-30 ENCOUNTER — Ambulatory Visit (INDEPENDENT_AMBULATORY_CARE_PROVIDER_SITE_OTHER): Payer: Medicare Other | Admitting: Internal Medicine

## 2015-08-30 ENCOUNTER — Encounter: Payer: Self-pay | Admitting: Internal Medicine

## 2015-08-30 VITALS — BP 112/68 | HR 76 | Temp 98.4°F | Resp 16 | Ht 67.0 in | Wt 183.0 lb

## 2015-08-30 DIAGNOSIS — N3000 Acute cystitis without hematuria: Secondary | ICD-10-CM | POA: Diagnosis not present

## 2015-08-30 LAB — POC URINALYSIS WITH MICROSCOPIC (NON AUTO)MANUAL RESULT
Bilirubin, UA: NEGATIVE
CRYSTALS: 0
Epithelial cells, urine per micros: 0
GLUCOSE UA: NEGATIVE
Ketones, UA: NEGATIVE
MUCUS UA: 0
Nitrite, UA: POSITIVE
PH UA: 6
Protein, UA: NEGATIVE
RBC: 5 M/uL (ref 4.04–5.48)
SPEC GRAV UA: 1.025
Urobilinogen, UA: 0.2

## 2015-08-30 MED ORDER — CIPROFLOXACIN HCL 500 MG PO TABS: 500.0000 mg | ORAL_TABLET | Freq: Two times a day (BID) | ORAL | Status: DC

## 2015-08-30 NOTE — Progress Notes (Signed)
Date:  08/30/2015   Name:  Alicia Hubbard   DOB:  09-16-1950   MRN:  [id number]   Chief Complaint: Urinary Tract Infection Urinary Tract Infection  This is a recurrent problem. The current episode started 1 to 4 weeks ago. The problem occurs every urination. The quality of the pain is described as aching. The pain is mild. There has been no fever. There is no history of pyelonephritis. Associated symptoms include chills, frequency and nausea. Pertinent negatives include no hematuria or vomiting. She has tried antibiotics (completed a week of Macrobid) for the symptoms. The treatment provided mild (felt well until 2 days ago) relief. Her past medical history is significant for kidney stones.    Review of Systems  Constitutional: Positive for chills. Negative for fever and appetite change.  Respiratory: Negative for cough, chest tightness and shortness of breath.   Cardiovascular: Negative for chest pain and leg swelling.  Gastrointestinal: Positive for nausea. Negative for vomiting.  Genitourinary: Positive for frequency. Negative for dysuria, hematuria and pelvic pain.  Neurological: Positive for headaches. Negative for dizziness and syncope.  Hematological: Negative for adenopathy.    Patient Active Problem List   Diagnosis Date Noted  . Kidney stones 08/15/2015  . Dyslipidemia 01/16/2015  . Essential (primary) hypertension 01/16/2015  . Blood in the urine 01/16/2015  . Local edema 01/16/2015  . OP (osteoporosis) 01/16/2015  . Sacrococcygeal disorders, not elsewhere classified 01/16/2015  . 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 Authorizing Provider  amLODipine (NORVASC) 5 MG tablet TAKE 1 TABLET DAILY 07/31/15  Yes Glean Hess, MD  aspirin 81 MG chewable tablet Chew 1 tablet by mouth daily.   Yes Historical Provider, MD  Calcium Carbonate-Vit D-Min (CALTRATE  600+D PLUS MINERALS) 600-800 MG-UNIT CHEW Chew by mouth.   Yes Historical Provider, MD  meloxicam (MOBIC) 15 MG tablet TAKE 1 TABLET DAILY 04/15/15  Yes Glean Hess, MD  nebivolol (BYSTOLIC) 5 MG tablet Take 1 tablet by mouth daily. 08/10/14  Yes Historical Provider, MD  simvastatin (ZOCOR) 10 MG tablet Take 1 tablet by mouth at bedtime. 08/10/14  Yes Historical Provider, MD  ciprofloxacin (CIPRO) 500 MG tablet Take 1 tablet (500 mg total) by mouth 2 (two) times daily. 08/30/15   Glean Hess, MD    Allergies  Allergen Reactions  . Penicillins     Other reaction(s): Hives    Past Surgical History  Procedure Laterality Date  . Total abdominal hysterectomy    . Cholecystectomy    . Tonsillectomy    . Appendectomy    . Foot fracture surgery Left 06/2014    Social History  Substance Use Topics  . Smoking status: Never Smoker   . Smokeless tobacco: None  . Alcohol Use: No     Medication list has been reviewed and updated.   Physical Exam  Constitutional: She appears well-developed and well-nourished.  Cardiovascular: Normal rate, regular rhythm and normal heart sounds.   Pulmonary/Chest: Effort normal and breath sounds normal. No respiratory distress.  Abdominal: Soft. Bowel sounds are normal. She exhibits no mass. There is no tenderness. There is no rebound, no guarding and no CVA tenderness.  Psychiatric: She has a normal mood and affect.  Nursing note and vitals reviewed.   BP 112/68 mmHg  Pulse 76  Temp(Src) 98.4 F (36.9 C) (Oral)  Resp 16  Ht 5\' 7"  (1.702 m)  Wt 183 lb (83.008 kg)  BMI 28.66 kg/m2  Assessment and Plan: 1. Acute cystitis without hematuria Did not respond to Macrobid Will change to Cipro and send for culture Suspect a retained bladder stone - may need Urology consultation - POC urinalysis w microscopic (non auto) - Urine Culture - ciprofloxacin (CIPRO) 500 MG tablet; Take 1 tablet (500 mg total) by mouth 2 (two) times daily.  Dispense: 20  tablet; Refill: 0   Halina Maidens, MD [facility]  08/30/2015

## 2015-09-01 LAB — URINE CULTURE

## 2015-09-05 ENCOUNTER — Other Ambulatory Visit: Payer: Medicare Other

## 2015-10-01 ENCOUNTER — Other Ambulatory Visit: Payer: Self-pay | Admitting: Internal Medicine

## 2015-10-05 ENCOUNTER — Other Ambulatory Visit: Payer: Self-pay | Admitting: Internal Medicine

## 2015-10-07 ENCOUNTER — Other Ambulatory Visit: Payer: Self-pay | Admitting: Internal Medicine

## 2015-10-07 MED ORDER — SIMVASTATIN 10 MG PO TABS
10.0000 mg | ORAL_TABLET | Freq: Every day | ORAL | 3 refills | Status: DC
Start: 2015-10-07 — End: 2016-09-18

## 2015-10-24 DIAGNOSIS — M9903 Segmental and somatic dysfunction of lumbar region: Secondary | ICD-10-CM | POA: Diagnosis not present

## 2015-10-24 DIAGNOSIS — M9902 Segmental and somatic dysfunction of thoracic region: Secondary | ICD-10-CM | POA: Diagnosis not present

## 2015-10-24 DIAGNOSIS — M5136 Other intervertebral disc degeneration, lumbar region: Secondary | ICD-10-CM | POA: Diagnosis not present

## 2015-10-24 DIAGNOSIS — M461 Sacroiliitis, not elsewhere classified: Secondary | ICD-10-CM | POA: Diagnosis not present

## 2015-10-24 DIAGNOSIS — M6080 Other myositis, unspecified site: Secondary | ICD-10-CM | POA: Diagnosis not present

## 2015-10-24 DIAGNOSIS — M9905 Segmental and somatic dysfunction of pelvic region: Secondary | ICD-10-CM | POA: Diagnosis not present

## 2015-10-29 DIAGNOSIS — M461 Sacroiliitis, not elsewhere classified: Secondary | ICD-10-CM | POA: Diagnosis not present

## 2015-10-29 DIAGNOSIS — M9902 Segmental and somatic dysfunction of thoracic region: Secondary | ICD-10-CM | POA: Diagnosis not present

## 2015-10-29 DIAGNOSIS — M6080 Other myositis, unspecified site: Secondary | ICD-10-CM | POA: Diagnosis not present

## 2015-10-29 DIAGNOSIS — M5136 Other intervertebral disc degeneration, lumbar region: Secondary | ICD-10-CM | POA: Diagnosis not present

## 2015-10-29 DIAGNOSIS — M9903 Segmental and somatic dysfunction of lumbar region: Secondary | ICD-10-CM | POA: Diagnosis not present

## 2015-10-29 DIAGNOSIS — M9905 Segmental and somatic dysfunction of pelvic region: Secondary | ICD-10-CM | POA: Diagnosis not present

## 2015-10-30 ENCOUNTER — Encounter: Payer: Self-pay | Admitting: Internal Medicine

## 2015-10-30 ENCOUNTER — Other Ambulatory Visit: Payer: Self-pay | Admitting: Internal Medicine

## 2015-10-30 ENCOUNTER — Ambulatory Visit (INDEPENDENT_AMBULATORY_CARE_PROVIDER_SITE_OTHER): Payer: Medicare Other | Admitting: Internal Medicine

## 2015-10-30 VITALS — BP 118/80 | HR 63 | Resp 16 | Ht 67.0 in | Wt 180.0 lb

## 2015-10-30 DIAGNOSIS — R319 Hematuria, unspecified: Secondary | ICD-10-CM | POA: Diagnosis not present

## 2015-10-30 LAB — POC URINALYSIS WITH MICROSCOPIC (NON AUTO)MANUAL RESULT
BACTERIA UA: 0
BILIRUBIN UA: NEGATIVE
Crystals: 0
EPITHELIAL CELLS, URINE PER MICROSCOPY: 0
GLUCOSE UA: NEGATIVE
Ketones, UA: NEGATIVE
Mucus, UA: 0
NITRITE UA: NEGATIVE
RBC: 50 M/uL — AB (ref 4.04–5.48)
Spec Grav, UA: 1.015
WBC Casts, UA: 0
pH, UA: 6

## 2015-10-30 NOTE — Progress Notes (Signed)
Date:  10/30/2015   Name:  Alicia Hubbard   DOB:  1950/08/26   MRN:  [id number]   Chief Complaint: Hematuria (Saw light pink color in urine. Once. Wants to check before traveling to [place]. No symptoms at all. )  Patient has a history of benign microscopic hematuria with the previous urology workup in the past about 10 years ago. Generally she does not see blood in her urine. She otherwise feels well with no fever or chills flank pain dysuria or frequency. She passed a kidney stone last May when she was out of town. CT at that time showed only the single ureteral stone.  Review of Systems  All other systems reviewed and are negative.   Patient Active Problem List   Diagnosis Date Noted  . Kidney stones 08/15/2015  . Dyslipidemia 01/16/2015  . Essential (primary) hypertension 01/16/2015  . Blood in the urine 01/16/2015  . Local edema 01/16/2015  . OP (osteoporosis) 01/16/2015  . Sacrococcygeal disorders, not elsewhere classified 01/16/2015  . 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 Authorizing Provider  amLODipine (NORVASC) 5 MG tablet TAKE 1 TABLET DAILY 07/31/15  Yes Glean Hess, MD  aspirin 81 MG chewable tablet Chew 1 tablet by mouth daily.   Yes Historical Provider, MD  BYSTOLIC 5 MG tablet TAKE 1 TABLET DAILY 10/01/15  Yes Glean Hess, MD  Calcium Carbonate-Vit D-Min (CALTRATE 600+D PLUS MINERALS) 600-800 MG-UNIT CHEW Chew by mouth.   Yes Historical Provider, MD  meloxicam (MOBIC) 15 MG tablet TAKE 1 TABLET DAILY 10/05/15  Yes Glean Hess, MD  simvastatin (ZOCOR) 10 MG tablet Take 1 tablet (10 mg total) by mouth at bedtime. 10/07/15  Yes Glean Hess, MD    Allergies  Allergen Reactions  . Penicillins     Other reaction(s): Hives    Past Surgical History:  Procedure Laterality Date  . APPENDECTOMY    . CHOLECYSTECTOMY    . FOOT FRACTURE  SURGERY Left 06/2014  . TONSILLECTOMY    . TOTAL ABDOMINAL HYSTERECTOMY      Social History  Substance Use Topics  . Smoking status: Never Smoker  . Smokeless tobacco: Never Used  . Alcohol use No     Medication list has been reviewed and updated.   Physical Exam  Constitutional: She is oriented to person, place, and time. She appears well-developed. No distress.  HENT:  Head: Normocephalic and atraumatic.  Cardiovascular: Normal rate, regular rhythm and normal heart sounds.   Pulmonary/Chest: Effort normal and breath sounds normal. No respiratory distress.  Abdominal: Soft. Bowel sounds are normal. She exhibits no distension. There is no tenderness.  Musculoskeletal: Normal range of motion.  Neurological: She is alert and oriented to person, place, and time.  Skin: Skin is warm and dry. No rash noted.  Psychiatric: She has a normal mood and affect. Her behavior is normal. Thought content normal.  Nursing note and vitals reviewed.   BP 118/80 (BP Location: Right Arm, Patient Position: Sitting, Cuff Size: Normal)   Pulse 63   Resp 16   Ht 5\' 7"  (1.702 m)   Wt 180 lb (81.6 kg)   SpO2 100%   BMI 28.19 kg/m   Assessment and Plan: 1. Blood in the urine Send for culture Refer to Urology when she returns from [facility] after Labor Day - POC urinalysis w microscopic (non auto) - Urine  Culture   Halina Maidens, MD [facility]  10/30/2015

## 2015-11-01 LAB — URINE CULTURE: ORGANISM ID, BACTERIA: NO GROWTH

## 2015-11-03 DIAGNOSIS — R109 Unspecified abdominal pain: Secondary | ICD-10-CM | POA: Diagnosis not present

## 2015-11-03 DIAGNOSIS — N132 Hydronephrosis with renal and ureteral calculous obstruction: Secondary | ICD-10-CM | POA: Diagnosis not present

## 2015-11-03 DIAGNOSIS — N200 Calculus of kidney: Secondary | ICD-10-CM | POA: Diagnosis not present

## 2015-11-03 DIAGNOSIS — K769 Liver disease, unspecified: Secondary | ICD-10-CM | POA: Diagnosis not present

## 2015-11-03 DIAGNOSIS — R1031 Right lower quadrant pain: Secondary | ICD-10-CM | POA: Diagnosis not present

## 2015-11-05 ENCOUNTER — Encounter: Payer: Self-pay | Admitting: Internal Medicine

## 2015-11-26 DIAGNOSIS — H2513 Age-related nuclear cataract, bilateral: Secondary | ICD-10-CM | POA: Diagnosis not present

## 2015-12-12 DIAGNOSIS — E210 Primary hyperparathyroidism: Secondary | ICD-10-CM | POA: Diagnosis not present

## 2015-12-12 DIAGNOSIS — N200 Calculus of kidney: Secondary | ICD-10-CM | POA: Diagnosis not present

## 2015-12-18 DIAGNOSIS — L570 Actinic keratosis: Secondary | ICD-10-CM | POA: Diagnosis not present

## 2015-12-18 DIAGNOSIS — L918 Other hypertrophic disorders of the skin: Secondary | ICD-10-CM | POA: Diagnosis not present

## 2015-12-18 DIAGNOSIS — Z23 Encounter for immunization: Secondary | ICD-10-CM | POA: Diagnosis not present

## 2015-12-18 DIAGNOSIS — B372 Candidiasis of skin and nail: Secondary | ICD-10-CM | POA: Diagnosis not present

## 2015-12-18 DIAGNOSIS — Z872 Personal history of diseases of the skin and subcutaneous tissue: Secondary | ICD-10-CM | POA: Diagnosis not present

## 2015-12-18 DIAGNOSIS — L821 Other seborrheic keratosis: Secondary | ICD-10-CM | POA: Diagnosis not present

## 2015-12-18 DIAGNOSIS — Z789 Other specified health status: Secondary | ICD-10-CM | POA: Diagnosis not present

## 2015-12-18 DIAGNOSIS — D485 Neoplasm of uncertain behavior of skin: Secondary | ICD-10-CM | POA: Diagnosis not present

## 2015-12-18 DIAGNOSIS — L298 Other pruritus: Secondary | ICD-10-CM | POA: Diagnosis not present

## 2015-12-18 DIAGNOSIS — L538 Other specified erythematous conditions: Secondary | ICD-10-CM | POA: Diagnosis not present

## 2015-12-18 DIAGNOSIS — Z1283 Encounter for screening for malignant neoplasm of skin: Secondary | ICD-10-CM | POA: Diagnosis not present

## 2015-12-18 DIAGNOSIS — B078 Other viral warts: Secondary | ICD-10-CM | POA: Diagnosis not present

## 2015-12-23 DIAGNOSIS — N200 Calculus of kidney: Secondary | ICD-10-CM | POA: Diagnosis not present

## 2015-12-24 DIAGNOSIS — E213 Hyperparathyroidism, unspecified: Secondary | ICD-10-CM | POA: Diagnosis not present

## 2015-12-24 DIAGNOSIS — Z880 Allergy status to penicillin: Secondary | ICD-10-CM | POA: Diagnosis not present

## 2015-12-24 DIAGNOSIS — E041 Nontoxic single thyroid nodule: Secondary | ICD-10-CM | POA: Diagnosis not present

## 2015-12-24 DIAGNOSIS — E7800 Pure hypercholesterolemia, unspecified: Secondary | ICD-10-CM | POA: Diagnosis not present

## 2015-12-24 DIAGNOSIS — Z8249 Family history of ischemic heart disease and other diseases of the circulatory system: Secondary | ICD-10-CM | POA: Diagnosis not present

## 2015-12-24 DIAGNOSIS — E210 Primary hyperparathyroidism: Secondary | ICD-10-CM | POA: Diagnosis not present

## 2015-12-24 DIAGNOSIS — R160 Hepatomegaly, not elsewhere classified: Secondary | ICD-10-CM | POA: Diagnosis not present

## 2015-12-24 DIAGNOSIS — I10 Essential (primary) hypertension: Secondary | ICD-10-CM | POA: Diagnosis not present

## 2015-12-31 DIAGNOSIS — D485 Neoplasm of uncertain behavior of skin: Secondary | ICD-10-CM | POA: Diagnosis not present

## 2015-12-31 DIAGNOSIS — D2271 Melanocytic nevi of right lower limb, including hip: Secondary | ICD-10-CM | POA: Diagnosis not present

## 2015-12-31 DIAGNOSIS — D225 Melanocytic nevi of trunk: Secondary | ICD-10-CM | POA: Diagnosis not present

## 2016-01-07 DIAGNOSIS — E7800 Pure hypercholesterolemia, unspecified: Secondary | ICD-10-CM | POA: Diagnosis not present

## 2016-01-07 DIAGNOSIS — Z880 Allergy status to penicillin: Secondary | ICD-10-CM | POA: Diagnosis not present

## 2016-01-07 DIAGNOSIS — N29 Other disorders of kidney and ureter in diseases classified elsewhere: Secondary | ICD-10-CM | POA: Diagnosis not present

## 2016-01-07 DIAGNOSIS — Z79899 Other long term (current) drug therapy: Secondary | ICD-10-CM | POA: Diagnosis not present

## 2016-01-07 DIAGNOSIS — Z8249 Family history of ischemic heart disease and other diseases of the circulatory system: Secondary | ICD-10-CM | POA: Diagnosis not present

## 2016-01-07 DIAGNOSIS — N200 Calculus of kidney: Secondary | ICD-10-CM | POA: Diagnosis not present

## 2016-01-07 DIAGNOSIS — Z7982 Long term (current) use of aspirin: Secondary | ICD-10-CM | POA: Diagnosis not present

## 2016-01-07 DIAGNOSIS — I10 Essential (primary) hypertension: Secondary | ICD-10-CM | POA: Diagnosis not present

## 2016-01-07 DIAGNOSIS — E210 Primary hyperparathyroidism: Secondary | ICD-10-CM | POA: Diagnosis not present

## 2016-01-07 DIAGNOSIS — E042 Nontoxic multinodular goiter: Secondary | ICD-10-CM | POA: Diagnosis not present

## 2016-01-10 ENCOUNTER — Encounter: Payer: Self-pay | Admitting: Internal Medicine

## 2016-01-14 DIAGNOSIS — E042 Nontoxic multinodular goiter: Secondary | ICD-10-CM | POA: Diagnosis not present

## 2016-01-14 DIAGNOSIS — N200 Calculus of kidney: Secondary | ICD-10-CM | POA: Diagnosis not present

## 2016-01-14 DIAGNOSIS — K769 Liver disease, unspecified: Secondary | ICD-10-CM | POA: Diagnosis not present

## 2016-01-14 DIAGNOSIS — R160 Hepatomegaly, not elsewhere classified: Secondary | ICD-10-CM | POA: Diagnosis not present

## 2016-01-14 DIAGNOSIS — E041 Nontoxic single thyroid nodule: Secondary | ICD-10-CM | POA: Diagnosis not present

## 2016-01-21 DIAGNOSIS — M9902 Segmental and somatic dysfunction of thoracic region: Secondary | ICD-10-CM | POA: Diagnosis not present

## 2016-01-21 DIAGNOSIS — M9905 Segmental and somatic dysfunction of pelvic region: Secondary | ICD-10-CM | POA: Diagnosis not present

## 2016-01-21 DIAGNOSIS — M5136 Other intervertebral disc degeneration, lumbar region: Secondary | ICD-10-CM | POA: Diagnosis not present

## 2016-01-21 DIAGNOSIS — M6080 Other myositis, unspecified site: Secondary | ICD-10-CM | POA: Diagnosis not present

## 2016-01-21 DIAGNOSIS — M461 Sacroiliitis, not elsewhere classified: Secondary | ICD-10-CM | POA: Diagnosis not present

## 2016-01-21 DIAGNOSIS — M9903 Segmental and somatic dysfunction of lumbar region: Secondary | ICD-10-CM | POA: Diagnosis not present

## 2016-01-24 DIAGNOSIS — M5136 Other intervertebral disc degeneration, lumbar region: Secondary | ICD-10-CM | POA: Diagnosis not present

## 2016-01-24 DIAGNOSIS — M9905 Segmental and somatic dysfunction of pelvic region: Secondary | ICD-10-CM | POA: Diagnosis not present

## 2016-01-24 DIAGNOSIS — M9903 Segmental and somatic dysfunction of lumbar region: Secondary | ICD-10-CM | POA: Diagnosis not present

## 2016-01-24 DIAGNOSIS — M6080 Other myositis, unspecified site: Secondary | ICD-10-CM | POA: Diagnosis not present

## 2016-01-24 DIAGNOSIS — M461 Sacroiliitis, not elsewhere classified: Secondary | ICD-10-CM | POA: Diagnosis not present

## 2016-01-24 DIAGNOSIS — M9902 Segmental and somatic dysfunction of thoracic region: Secondary | ICD-10-CM | POA: Diagnosis not present

## 2016-01-28 DIAGNOSIS — E041 Nontoxic single thyroid nodule: Secondary | ICD-10-CM | POA: Diagnosis not present

## 2016-02-14 DIAGNOSIS — M9903 Segmental and somatic dysfunction of lumbar region: Secondary | ICD-10-CM | POA: Diagnosis not present

## 2016-02-14 DIAGNOSIS — M9902 Segmental and somatic dysfunction of thoracic region: Secondary | ICD-10-CM | POA: Diagnosis not present

## 2016-02-14 DIAGNOSIS — M5136 Other intervertebral disc degeneration, lumbar region: Secondary | ICD-10-CM | POA: Diagnosis not present

## 2016-02-14 DIAGNOSIS — M461 Sacroiliitis, not elsewhere classified: Secondary | ICD-10-CM | POA: Diagnosis not present

## 2016-02-14 DIAGNOSIS — M6080 Other myositis, unspecified site: Secondary | ICD-10-CM | POA: Diagnosis not present

## 2016-02-14 DIAGNOSIS — M9905 Segmental and somatic dysfunction of pelvic region: Secondary | ICD-10-CM | POA: Diagnosis not present

## 2016-02-25 DIAGNOSIS — M9902 Segmental and somatic dysfunction of thoracic region: Secondary | ICD-10-CM | POA: Diagnosis not present

## 2016-02-25 DIAGNOSIS — M461 Sacroiliitis, not elsewhere classified: Secondary | ICD-10-CM | POA: Diagnosis not present

## 2016-02-25 DIAGNOSIS — M5136 Other intervertebral disc degeneration, lumbar region: Secondary | ICD-10-CM | POA: Diagnosis not present

## 2016-02-25 DIAGNOSIS — M9905 Segmental and somatic dysfunction of pelvic region: Secondary | ICD-10-CM | POA: Diagnosis not present

## 2016-02-25 DIAGNOSIS — M9903 Segmental and somatic dysfunction of lumbar region: Secondary | ICD-10-CM | POA: Diagnosis not present

## 2016-02-25 DIAGNOSIS — M6080 Other myositis, unspecified site: Secondary | ICD-10-CM | POA: Diagnosis not present

## 2016-02-27 DIAGNOSIS — M461 Sacroiliitis, not elsewhere classified: Secondary | ICD-10-CM | POA: Diagnosis not present

## 2016-02-27 DIAGNOSIS — M5136 Other intervertebral disc degeneration, lumbar region: Secondary | ICD-10-CM | POA: Diagnosis not present

## 2016-02-27 DIAGNOSIS — M6080 Other myositis, unspecified site: Secondary | ICD-10-CM | POA: Diagnosis not present

## 2016-02-27 DIAGNOSIS — M9902 Segmental and somatic dysfunction of thoracic region: Secondary | ICD-10-CM | POA: Diagnosis not present

## 2016-02-27 DIAGNOSIS — M9905 Segmental and somatic dysfunction of pelvic region: Secondary | ICD-10-CM | POA: Diagnosis not present

## 2016-02-27 DIAGNOSIS — M9903 Segmental and somatic dysfunction of lumbar region: Secondary | ICD-10-CM | POA: Diagnosis not present

## 2016-03-02 DIAGNOSIS — M9903 Segmental and somatic dysfunction of lumbar region: Secondary | ICD-10-CM | POA: Diagnosis not present

## 2016-03-02 DIAGNOSIS — M5136 Other intervertebral disc degeneration, lumbar region: Secondary | ICD-10-CM | POA: Diagnosis not present

## 2016-03-02 DIAGNOSIS — M9902 Segmental and somatic dysfunction of thoracic region: Secondary | ICD-10-CM | POA: Diagnosis not present

## 2016-03-02 DIAGNOSIS — M6080 Other myositis, unspecified site: Secondary | ICD-10-CM | POA: Diagnosis not present

## 2016-03-02 DIAGNOSIS — M461 Sacroiliitis, not elsewhere classified: Secondary | ICD-10-CM | POA: Diagnosis not present

## 2016-03-02 DIAGNOSIS — M9905 Segmental and somatic dysfunction of pelvic region: Secondary | ICD-10-CM | POA: Diagnosis not present

## 2016-03-04 DIAGNOSIS — M9903 Segmental and somatic dysfunction of lumbar region: Secondary | ICD-10-CM | POA: Diagnosis not present

## 2016-03-04 DIAGNOSIS — M461 Sacroiliitis, not elsewhere classified: Secondary | ICD-10-CM | POA: Diagnosis not present

## 2016-03-04 DIAGNOSIS — M6080 Other myositis, unspecified site: Secondary | ICD-10-CM | POA: Diagnosis not present

## 2016-03-04 DIAGNOSIS — M5136 Other intervertebral disc degeneration, lumbar region: Secondary | ICD-10-CM | POA: Diagnosis not present

## 2016-03-04 DIAGNOSIS — M9902 Segmental and somatic dysfunction of thoracic region: Secondary | ICD-10-CM | POA: Diagnosis not present

## 2016-03-04 DIAGNOSIS — M9905 Segmental and somatic dysfunction of pelvic region: Secondary | ICD-10-CM | POA: Diagnosis not present

## 2016-04-06 ENCOUNTER — Other Ambulatory Visit: Payer: Self-pay | Admitting: Internal Medicine

## 2016-07-24 ENCOUNTER — Other Ambulatory Visit: Payer: Self-pay | Admitting: Internal Medicine

## 2016-09-18 ENCOUNTER — Other Ambulatory Visit: Payer: Self-pay | Admitting: Internal Medicine

## 2016-09-21 NOTE — Telephone Encounter (Signed)
Called pt to schedule appointment as advised and pt informed she has moved to [place] and will see a PCP at the end of this month.

## 2016-09-21 NOTE — Telephone Encounter (Signed)
Pt appreciates refill, has moved to [facility] and will see a dr this month.

## 2016-10-01 DIAGNOSIS — N200 Calculus of kidney: Secondary | ICD-10-CM | POA: Diagnosis not present

## 2016-10-01 DIAGNOSIS — R351 Nocturia: Secondary | ICD-10-CM | POA: Diagnosis not present

## 2016-10-07 DIAGNOSIS — N200 Calculus of kidney: Secondary | ICD-10-CM | POA: Diagnosis not present

## 2016-10-08 DIAGNOSIS — Z6828 Body mass index (BMI) 28.0-28.9, adult: Secondary | ICD-10-CM | POA: Diagnosis not present

## 2016-10-08 DIAGNOSIS — E782 Mixed hyperlipidemia: Secondary | ICD-10-CM | POA: Diagnosis not present

## 2016-10-08 DIAGNOSIS — I10 Essential (primary) hypertension: Secondary | ICD-10-CM | POA: Diagnosis not present

## 2016-10-08 DIAGNOSIS — Z87442 Personal history of urinary calculi: Secondary | ICD-10-CM | POA: Diagnosis not present

## 2016-10-08 DIAGNOSIS — Z1231 Encounter for screening mammogram for malignant neoplasm of breast: Secondary | ICD-10-CM | POA: Diagnosis not present

## 2016-10-08 DIAGNOSIS — E041 Nontoxic single thyroid nodule: Secondary | ICD-10-CM | POA: Diagnosis not present

## 2016-10-08 DIAGNOSIS — Z23 Encounter for immunization: Secondary | ICD-10-CM | POA: Diagnosis not present

## 2016-10-08 DIAGNOSIS — G8929 Other chronic pain: Secondary | ICD-10-CM | POA: Diagnosis not present

## 2016-10-08 DIAGNOSIS — M810 Age-related osteoporosis without current pathological fracture: Secondary | ICD-10-CM | POA: Diagnosis not present

## 2016-10-08 DIAGNOSIS — M5441 Lumbago with sciatica, right side: Secondary | ICD-10-CM | POA: Diagnosis not present

## 2016-10-16 DIAGNOSIS — R319 Hematuria, unspecified: Secondary | ICD-10-CM | POA: Diagnosis not present

## 2016-10-29 DIAGNOSIS — R319 Hematuria, unspecified: Secondary | ICD-10-CM | POA: Diagnosis not present

## 2016-10-29 DIAGNOSIS — R5381 Other malaise: Secondary | ICD-10-CM | POA: Diagnosis not present

## 2016-11-05 DIAGNOSIS — Z6828 Body mass index (BMI) 28.0-28.9, adult: Secondary | ICD-10-CM | POA: Diagnosis not present

## 2016-11-05 DIAGNOSIS — E663 Overweight: Secondary | ICD-10-CM | POA: Diagnosis not present

## 2016-11-05 DIAGNOSIS — M8588 Other specified disorders of bone density and structure, other site: Secondary | ICD-10-CM | POA: Diagnosis not present

## 2016-11-05 DIAGNOSIS — Z1231 Encounter for screening mammogram for malignant neoplasm of breast: Secondary | ICD-10-CM | POA: Diagnosis not present

## 2016-11-05 DIAGNOSIS — Z0000 Encounter for general adult medical examination without abnormal findings: Secondary | ICD-10-CM | POA: Diagnosis not present

## 2016-11-25 DIAGNOSIS — N1330 Unspecified hydronephrosis: Secondary | ICD-10-CM | POA: Diagnosis not present

## 2016-11-25 DIAGNOSIS — R351 Nocturia: Secondary | ICD-10-CM | POA: Diagnosis not present

## 2016-11-25 DIAGNOSIS — N200 Calculus of kidney: Secondary | ICD-10-CM | POA: Diagnosis not present

## 2016-11-25 DIAGNOSIS — N281 Cyst of kidney, acquired: Secondary | ICD-10-CM | POA: Diagnosis not present

## 2016-11-26 DIAGNOSIS — Z6828 Body mass index (BMI) 28.0-28.9, adult: Secondary | ICD-10-CM | POA: Diagnosis not present

## 2016-11-26 DIAGNOSIS — M8588 Other specified disorders of bone density and structure, other site: Secondary | ICD-10-CM | POA: Diagnosis not present

## 2016-11-26 DIAGNOSIS — E559 Vitamin D deficiency, unspecified: Secondary | ICD-10-CM | POA: Diagnosis not present

## 2016-11-26 DIAGNOSIS — Z23 Encounter for immunization: Secondary | ICD-10-CM | POA: Diagnosis not present

## 2016-11-30 DIAGNOSIS — M810 Age-related osteoporosis without current pathological fracture: Secondary | ICD-10-CM | POA: Diagnosis not present

## 2016-12-01 DIAGNOSIS — D225 Melanocytic nevi of trunk: Secondary | ICD-10-CM | POA: Diagnosis not present

## 2016-12-01 DIAGNOSIS — L814 Other melanin hyperpigmentation: Secondary | ICD-10-CM | POA: Diagnosis not present

## 2016-12-01 DIAGNOSIS — L821 Other seborrheic keratosis: Secondary | ICD-10-CM | POA: Diagnosis not present

## 2016-12-01 DIAGNOSIS — D485 Neoplasm of uncertain behavior of skin: Secondary | ICD-10-CM | POA: Diagnosis not present

## 2016-12-01 DIAGNOSIS — Z872 Personal history of diseases of the skin and subcutaneous tissue: Secondary | ICD-10-CM | POA: Diagnosis not present

## 2016-12-01 DIAGNOSIS — D2271 Melanocytic nevi of right lower limb, including hip: Secondary | ICD-10-CM | POA: Diagnosis not present

## 2016-12-04 DIAGNOSIS — H02839 Dermatochalasis of unspecified eye, unspecified eyelid: Secondary | ICD-10-CM | POA: Diagnosis not present

## 2016-12-04 DIAGNOSIS — H2513 Age-related nuclear cataract, bilateral: Secondary | ICD-10-CM | POA: Diagnosis not present

## 2016-12-04 DIAGNOSIS — H524 Presbyopia: Secondary | ICD-10-CM | POA: Diagnosis not present

## 2016-12-04 DIAGNOSIS — H43393 Other vitreous opacities, bilateral: Secondary | ICD-10-CM | POA: Diagnosis not present

## 2016-12-04 DIAGNOSIS — I10 Essential (primary) hypertension: Secondary | ICD-10-CM | POA: Diagnosis not present

## 2017-05-14 DIAGNOSIS — N200 Calculus of kidney: Secondary | ICD-10-CM | POA: Diagnosis not present

## 2017-05-14 DIAGNOSIS — N281 Cyst of kidney, acquired: Secondary | ICD-10-CM | POA: Diagnosis not present

## 2017-05-17 DIAGNOSIS — R3989 Other symptoms and signs involving the genitourinary system: Secondary | ICD-10-CM | POA: Diagnosis not present

## 2017-05-17 DIAGNOSIS — M839 Adult osteomalacia, unspecified: Secondary | ICD-10-CM | POA: Diagnosis not present

## 2017-05-17 DIAGNOSIS — R5381 Other malaise: Secondary | ICD-10-CM | POA: Diagnosis not present

## 2017-05-17 DIAGNOSIS — N200 Calculus of kidney: Secondary | ICD-10-CM | POA: Diagnosis not present

## 2017-05-17 DIAGNOSIS — N201 Calculus of ureter: Secondary | ICD-10-CM | POA: Diagnosis not present

## 2017-05-17 DIAGNOSIS — R351 Nocturia: Secondary | ICD-10-CM | POA: Diagnosis not present

## 2017-05-17 DIAGNOSIS — N189 Chronic kidney disease, unspecified: Secondary | ICD-10-CM | POA: Diagnosis not present

## 2017-05-24 DIAGNOSIS — M8588 Other specified disorders of bone density and structure, other site: Secondary | ICD-10-CM | POA: Diagnosis not present

## 2017-05-26 DIAGNOSIS — N200 Calculus of kidney: Secondary | ICD-10-CM | POA: Diagnosis not present

## 2017-05-26 DIAGNOSIS — R3129 Other microscopic hematuria: Secondary | ICD-10-CM | POA: Diagnosis not present

## 2017-05-26 DIAGNOSIS — R3989 Other symptoms and signs involving the genitourinary system: Secondary | ICD-10-CM | POA: Diagnosis not present

## 2017-05-26 DIAGNOSIS — N281 Cyst of kidney, acquired: Secondary | ICD-10-CM | POA: Diagnosis not present

## 2017-06-01 DIAGNOSIS — Z872 Personal history of diseases of the skin and subcutaneous tissue: Secondary | ICD-10-CM | POA: Diagnosis not present

## 2017-06-01 DIAGNOSIS — L821 Other seborrheic keratosis: Secondary | ICD-10-CM | POA: Diagnosis not present

## 2017-06-01 DIAGNOSIS — D2271 Melanocytic nevi of right lower limb, including hip: Secondary | ICD-10-CM | POA: Diagnosis not present

## 2017-06-01 DIAGNOSIS — L538 Other specified erythematous conditions: Secondary | ICD-10-CM | POA: Diagnosis not present

## 2017-06-01 DIAGNOSIS — R58 Hemorrhage, not elsewhere classified: Secondary | ICD-10-CM | POA: Diagnosis not present

## 2017-06-01 DIAGNOSIS — L814 Other melanin hyperpigmentation: Secondary | ICD-10-CM | POA: Diagnosis not present

## 2017-06-01 DIAGNOSIS — L820 Inflamed seborrheic keratosis: Secondary | ICD-10-CM | POA: Diagnosis not present

## 2017-06-01 DIAGNOSIS — D485 Neoplasm of uncertain behavior of skin: Secondary | ICD-10-CM | POA: Diagnosis not present

## 2017-06-01 DIAGNOSIS — D225 Melanocytic nevi of trunk: Secondary | ICD-10-CM | POA: Diagnosis not present

## 2017-06-01 DIAGNOSIS — D2239 Melanocytic nevi of other parts of face: Secondary | ICD-10-CM | POA: Diagnosis not present

## 2017-06-02 DIAGNOSIS — M810 Age-related osteoporosis without current pathological fracture: Secondary | ICD-10-CM | POA: Diagnosis not present

## 2017-07-16 DIAGNOSIS — D485 Neoplasm of uncertain behavior of skin: Secondary | ICD-10-CM | POA: Diagnosis not present

## 2017-07-16 DIAGNOSIS — D225 Melanocytic nevi of trunk: Secondary | ICD-10-CM | POA: Diagnosis not present

## 2017-07-31 DIAGNOSIS — H43811 Vitreous degeneration, right eye: Secondary | ICD-10-CM | POA: Diagnosis not present

## 2017-08-11 DIAGNOSIS — H43811 Vitreous degeneration, right eye: Secondary | ICD-10-CM | POA: Diagnosis not present

## 2017-08-11 DIAGNOSIS — H43812 Vitreous degeneration, left eye: Secondary | ICD-10-CM | POA: Diagnosis not present

## 2017-08-11 DIAGNOSIS — H43813 Vitreous degeneration, bilateral: Secondary | ICD-10-CM | POA: Diagnosis not present
